# Patient Record
Sex: Male | Born: 1935 | Race: White | Hispanic: No | Marital: Married | Smoking: Former smoker
Health system: Southern US, Community
[De-identification: ages and names within clinical notes are randomized; demographics above are authoritative.]

## PROBLEM LIST (undated history)

## (undated) DIAGNOSIS — E119 Type 2 diabetes mellitus without complications: Secondary | ICD-10-CM

## (undated) DIAGNOSIS — I739 Peripheral vascular disease, unspecified: Secondary | ICD-10-CM

## (undated) DIAGNOSIS — I10 Essential (primary) hypertension: Secondary | ICD-10-CM

## (undated) DIAGNOSIS — F0390 Unspecified dementia without behavioral disturbance: Secondary | ICD-10-CM

## (undated) DIAGNOSIS — E785 Hyperlipidemia, unspecified: Secondary | ICD-10-CM

## (undated) DIAGNOSIS — G459 Transient cerebral ischemic attack, unspecified: Secondary | ICD-10-CM

## (undated) DIAGNOSIS — N400 Enlarged prostate without lower urinary tract symptoms: Secondary | ICD-10-CM

---

## 2006-10-06 ENCOUNTER — Ambulatory Visit: Payer: Self-pay | Admitting: Unknown Physician Specialty

## 2007-03-09 ENCOUNTER — Ambulatory Visit: Payer: Self-pay

## 2007-04-04 ENCOUNTER — Ambulatory Visit: Payer: Self-pay | Admitting: Rheumatology

## 2007-06-06 ENCOUNTER — Emergency Department: Payer: Self-pay | Admitting: Emergency Medicine

## 2007-06-06 ENCOUNTER — Other Ambulatory Visit: Payer: Self-pay

## 2008-06-16 ENCOUNTER — Inpatient Hospital Stay: Payer: Self-pay | Admitting: Internal Medicine

## 2014-01-28 ENCOUNTER — Ambulatory Visit: Payer: Self-pay | Admitting: Physician Assistant

## 2014-04-16 ENCOUNTER — Emergency Department
Admit: 2014-04-16 | Disposition: A | Discharge status: Home / Self Care | Payer: Self-pay | Admitting: Emergency Medicine

## 2014-07-21 ENCOUNTER — Other Ambulatory Visit: Payer: Self-pay

## 2014-07-21 ENCOUNTER — Emergency Department: Payer: Commercial Managed Care - HMO

## 2014-07-21 ENCOUNTER — Emergency Department
Admission: EM | Admit: 2014-07-21 | Discharge: 2014-07-22 | Disposition: A | Discharge status: Home / Self Care | Payer: Commercial Managed Care - HMO | Attending: Emergency Medicine | Admitting: Emergency Medicine

## 2014-07-21 ENCOUNTER — Encounter: Payer: Self-pay | Admitting: Emergency Medicine

## 2014-07-21 DIAGNOSIS — M542 Cervicalgia: Secondary | ICD-10-CM

## 2014-07-21 DIAGNOSIS — S199XXA Unspecified injury of neck, initial encounter: Secondary | ICD-10-CM | POA: Insufficient documentation

## 2014-07-21 DIAGNOSIS — W01198A Fall on same level from slipping, tripping and stumbling with subsequent striking against other object, initial encounter: Secondary | ICD-10-CM | POA: Insufficient documentation

## 2014-07-21 DIAGNOSIS — S3992XA Unspecified injury of lower back, initial encounter: Secondary | ICD-10-CM | POA: Diagnosis present

## 2014-07-21 DIAGNOSIS — Z87891 Personal history of nicotine dependence: Secondary | ICD-10-CM | POA: Diagnosis not present

## 2014-07-21 DIAGNOSIS — Y9289 Other specified places as the place of occurrence of the external cause: Secondary | ICD-10-CM | POA: Insufficient documentation

## 2014-07-21 DIAGNOSIS — Z880 Allergy status to penicillin: Secondary | ICD-10-CM | POA: Diagnosis not present

## 2014-07-21 DIAGNOSIS — Y9389 Activity, other specified: Secondary | ICD-10-CM | POA: Diagnosis not present

## 2014-07-21 DIAGNOSIS — S8991XA Unspecified injury of right lower leg, initial encounter: Secondary | ICD-10-CM | POA: Diagnosis not present

## 2014-07-21 DIAGNOSIS — W19XXXA Unspecified fall, initial encounter: Secondary | ICD-10-CM

## 2014-07-21 DIAGNOSIS — M79604 Pain in right leg: Secondary | ICD-10-CM

## 2014-07-21 DIAGNOSIS — Y998 Other external cause status: Secondary | ICD-10-CM | POA: Insufficient documentation

## 2014-07-21 DIAGNOSIS — E119 Type 2 diabetes mellitus without complications: Secondary | ICD-10-CM | POA: Diagnosis not present

## 2014-07-21 HISTORY — DX: Type 2 diabetes mellitus without complications: E11.9

## 2014-07-21 HISTORY — DX: Transient cerebral ischemic attack, unspecified: G45.9

## 2014-07-21 LAB — BASIC METABOLIC PANEL WITH GFR
Anion gap: 10 (ref 5–15)
BUN: 19 mg/dL (ref 6–20)
CO2: 26 mmol/L (ref 22–32)
Calcium: 9.8 mg/dL (ref 8.9–10.3)
Chloride: 98 mmol/L — ABNORMAL LOW (ref 101–111)
Creatinine, Ser: 1.19 mg/dL (ref 0.61–1.24)
GFR calc Af Amer: >60 mL/min (ref >60)
GFR calc non Af Amer: 53 mL/min — ABNORMAL LOW (ref >60)
Glucose, Bld: 194 mg/dL — ABNORMAL HIGH (ref 65–99)
Potassium: 4.2 mmol/L (ref 3.5–5.1)
Sodium: 134 mmol/L — ABNORMAL LOW (ref 135–145)

## 2014-07-21 LAB — CBC
HCT: 40.6 % (ref 40.0–52.0)
Hemoglobin: 13 g/dL (ref 13.0–18.0)
MCH: 26 pg (ref 26.0–34.0)
MCHC: 32 g/dL (ref 32.0–36.0)
MCV: 81.2 fL (ref 80.0–100.0)
Platelets: 174 10*3/uL (ref 150–440)
RBC: 5 MIL/uL (ref 4.40–5.90)
RDW: 14.1 % (ref 11.5–14.5)
WBC: 8.1 10*3/uL (ref 3.8–10.6)

## 2014-07-21 LAB — GLUCOSE, CAPILLARY: Glucose-Capillary: 189 mg/dL — ABNORMAL HIGH (ref 65–99)

## 2014-07-21 NOTE — ED Notes (Signed)
MD at bedside. 

## 2014-07-21 NOTE — ED Notes (Signed)
Patient transported to X-ray 

## 2014-07-21 NOTE — ED Notes (Signed)
Patient brought in by ems from home. Patient states that he got a little "skakey" and fell. Patient states that he did hit the back of his head but denies LOC. Patient with complaint of right upper leg pain.

## 2014-07-22 LAB — URINALYSIS COMPLETE WITH MICROSCOPIC (ARMC ONLY)
Bacteria, UA: NONE SEEN
Bilirubin Urine: NEGATIVE
Glucose, UA: 50 mg/dL — AB
HGB URINE DIPSTICK: NEGATIVE
Ketones, ur: NEGATIVE mg/dL
LEUKOCYTES UA: NEGATIVE
Nitrite: NEGATIVE
PH: 7 (ref 5.0–8.0)
Protein, ur: NEGATIVE mg/dL
RBC / HPF: NONE SEEN RBC/hpf (ref 0–5)
SPECIFIC GRAVITY, URINE: 1.008 (ref 1.005–1.030)

## 2014-07-22 LAB — TROPONIN I: Troponin I: <0.03 ng/mL (ref <0.031)

## 2014-07-22 MED ORDER — OXYCODONE-ACETAMINOPHEN 5-325 MG PO TABS
1.0000 | ORAL_TABLET | Freq: Once | ORAL | Status: AC
  Administered 2014-07-22: 1 via ORAL

## 2014-07-22 MED ORDER — OXYCODONE-ACETAMINOPHEN 5-325 MG PO TABS
ORAL_TABLET | ORAL | Status: AC
  Administered 2014-07-22: 1 via ORAL
  Filled 2014-07-22: qty 1

## 2014-07-22 NOTE — Discharge Instructions (Signed)
Musculoskeletal Pain Musculoskeletal pain is muscle and boney aches and pains. These pains can occur in any part of the body. Your caregiver may treat you without knowing the cause of the pain. They may treat you if blood or urine tests, X-rays, and other tests were normal.  CAUSES There is often not a definite cause or reason for these pains. These pains may be caused by a type of germ (virus). The discomfort may also come from overuse. Overuse includes working out too hard when your body is not fit. Boney aches also come from weather changes. Bone is sensitive to atmospheric pressure changes. HOME CARE INSTRUCTIONS   Ask when your test results will be ready. Make sure you get your test results.  Only take over-the-counter or prescription medicines for pain, discomfort, or fever as directed by your caregiver. If you were given medications for your condition, do not drive, operate machinery or power tools, or sign legal documents for 24 hours. Do not drink alcohol. Do not take sleeping pills or other medications that may interfere with treatment.  Continue all activities unless the activities cause more pain. When the pain lessens, slowly resume normal activities. Gradually increase the intensity and duration of the activities or exercise.  During periods of severe pain, bed rest may be helpful. Lay or sit in any position that is comfortable.  Putting ice on the injured area.  Put ice in a bag.  Place a towel between your skin and the bag.  Leave the ice on for 15 to 20 minutes, 3 to 4 times a day.  Follow up with your caregiver for continued problems and no reason can be found for the pain. If the pain becomes worse or does not go away, it may be necessary to repeat tests or do additional testing. Your caregiver may need to look further for a possible cause. SEEK IMMEDIATE MEDICAL CARE IF:  You have pain that is getting worse and is not relieved by medications.  You develop chest pain  that is associated with shortness or breath, sweating, feeling sick to your stomach (nauseous), or throw up (vomit).  Your pain becomes localized to the abdomen.  You develop any new symptoms that seem different or that concern you. MAKE SURE YOU:   Understand these instructions.  Will watch your condition.  Will get help right away if you are not doing well or get worse. Document Released: 12/28/2004 Document Revised: 03/22/2011 Document Reviewed: 09/01/2012 [facility] Patient Information 2015 [place], [place]. This information is not intended to replace advice given to you by your health care provider. Make sure you discuss any questions you have with your health care provider.  Cervical Sprain A cervical sprain is an injury in the neck in which the strong, fibrous tissues (ligaments) that connect your neck bones stretch or tear. Cervical sprains can range from mild to severe. Severe cervical sprains can cause the neck vertebrae to be unstable. This can lead to damage of the spinal cord and can result in serious nervous system problems. The amount of time it takes for a cervical sprain to get better depends on the cause and extent of the injury. Most cervical sprains heal in 1 to 3 weeks. CAUSES  Severe cervical sprains may be caused by:   Contact sport injuries (such as from football, rugby, wrestling, hockey, auto racing, gymnastics, diving, martial arts, or boxing).   Motor vehicle collisions.   Whiplash injuries. This is an injury from a sudden forward and backward whipping  movement of the head and neck.  Falls.  Mild cervical sprains may be caused by:   Being in an awkward position, such as while cradling a telephone between your ear and shoulder.   Sitting in a chair that does not offer proper support.   Working at a poorly Marketing executive station.   Looking up or down for long periods of time.  SYMPTOMS   Pain, soreness, stiffness, or a burning sensation in the  front, back, or sides of the neck. This discomfort may develop immediately after the injury or slowly, 24 hours or more after the injury.   Pain or tenderness directly in the middle of the back of the neck.   Shoulder or upper back pain.   Limited ability to move the neck.   Headache.   Dizziness.   Weakness, numbness, or tingling in the hands or arms.   Muscle spasms.   Difficulty swallowing or chewing.   Tenderness and swelling of the neck.  DIAGNOSIS  Most of the time your health care provider can diagnose a cervical sprain by taking your history and doing a physical exam. Your health care provider will ask about previous neck injuries and any known neck problems, such as arthritis in the neck. X-rays may be taken to find out if there are any other problems, such as with the bones of the neck. Other tests, such as a CT scan or MRI, may also be needed.  TREATMENT  Treatment depends on the severity of the cervical sprain. Mild sprains can be treated with rest, keeping the neck in place (immobilization), and pain medicines. Severe cervical sprains are immediately immobilized. Further treatment is done to help with pain, muscle spasms, and other symptoms and may include:  Medicines, such as pain relievers, numbing medicines, or muscle relaxants.   Physical therapy. This may involve stretching exercises, strengthening exercises, and posture training. Exercises and improved posture can help stabilize the neck, strengthen muscles, and help stop symptoms from returning.  HOME CARE INSTRUCTIONS   Put ice on the injured area.   Put ice in a plastic bag.   Place a towel between your skin and the bag.   Leave the ice on for 15-20 minutes, 3-4 times a day.   If your injury was severe, you may have been given a cervical collar to wear. A cervical collar is a two-piece collar designed to keep your neck from moving while it heals.  Do not remove the collar unless instructed  by your health care provider.  If you have long hair, keep it outside of the collar.  Ask your health care provider before making any adjustments to your collar. Minor adjustments may be required over time to improve comfort and reduce pressure on your chin or on the back of your head.  Ifyou are allowed to remove the collar for cleaning or bathing, follow your health care provider's instructions on how to do so safely.  Keep your collar clean by wiping it with mild soap and water and drying it completely. If the collar you have been given includes removable pads, remove them every 1-2 days and hand wash them with soap and water. Allow them to air dry. They should be completely dry before you wear them in the collar.  If you are allowed to remove the collar for cleaning and bathing, wash and dry the skin of your neck. Check your skin for irritation or sores. If you see any, tell your health care provider.  Do not drive while wearing the collar.   Only take over-the-counter or prescription medicines for pain, discomfort, or fever as directed by your health care provider.   Keep all follow-up appointments as directed by your health care provider.   Keep all physical therapy appointments as directed by your health care provider.   Make any needed adjustments to your workstation to promote good posture.   Avoid positions and activities that make your symptoms worse.   Warm up and stretch before being active to help prevent problems.  SEEK MEDICAL CARE IF:   Your pain is not controlled with medicine.   You are unable to decrease your pain medicine over time as planned.   Your activity level is not improving as expected.  SEEK IMMEDIATE MEDICAL CARE IF:   You develop any bleeding.  You develop stomach upset.  You have signs of an allergic reaction to your medicine.   Your symptoms get worse.   You develop new, unexplained symptoms.   You have numbness, tingling,  weakness, or paralysis in any part of your body.  MAKE SURE YOU:   Understand these instructions.  Will watch your condition.  Will get help right away if you are not doing well or get worse. Document Released: 10/25/2006 Document Revised: 01/02/2013 Document Reviewed: 07/05/2012 [facility] Patient Information 2015 [place], [place]. This information is not intended to replace advice given to you by your health care provider. Make sure you discuss any questions you have with your health care provider.  Fall Prevention and Home Safety Falls cause injuries and can affect all age groups. It is possible to use preventive measures to significantly decrease the likelihood of falls. There are many simple measures which can make your home safer and prevent falls. OUTDOORS  Repair cracks and edges of walkways and driveways.  Remove high doorway thresholds.  Trim shrubbery on the main path into your home.  Have good outside lighting.  Clear walkways of tools, rocks, debris, and clutter.  Check that handrails are not broken and are securely fastened. Both sides of steps should have handrails.  Have leaves, snow, and ice cleared regularly.  Use sand or salt on walkways during winter months.  In the garage, clean up grease or oil spills. BATHROOM  Install night lights.  Install grab bars by the toilet and in the tub and shower.  Use non-skid mats or decals in the tub or shower.  Place a plastic non-slip stool in the shower to sit on, if needed.  Keep floors dry and clean up all water on the floor immediately.  Remove soap buildup in the tub or shower on a regular basis.  Secure bath mats with non-slip, double-sided rug tape.  Remove throw rugs and tripping hazards from the floors. BEDROOMS  Install night lights.  Make sure a bedside light is easy to reach.  Do not use oversized bedding.  Keep a telephone by your bedside.  Have a firm chair with side arms to use for getting  dressed.  Remove throw rugs and tripping hazards from the floor. KITCHEN  Keep handles on pots and pans turned toward the center of the stove. Use back burners when possible.  Clean up spills quickly and allow time for drying.  Avoid walking on wet floors.  Avoid hot utensils and knives.  Position shelves so they are not too high or low.  Place commonly used objects within easy reach.  If necessary, use a sturdy step stool with a grab bar  when reaching.  Keep electrical cables out of the way.  Do not use floor polish or wax that makes floors slippery. If you must use wax, use non-skid floor wax.  Remove throw rugs and tripping hazards from the floor. STAIRWAYS  Never leave objects on stairs.  Place handrails on both sides of stairways and use them. Fix any loose handrails. Make sure handrails on both sides of the stairways are as long as the stairs.  Check carpeting to make sure it is firmly attached along stairs. Make repairs to worn or loose carpet promptly.  Avoid placing throw rugs at the top or bottom of stairways, or properly secure the rug with carpet tape to prevent slippage. Get rid of throw rugs, if possible.  Have an electrician put in a light switch at the top and bottom of the stairs. OTHER FALL PREVENTION TIPS  Wear low-heel or rubber-soled shoes that are supportive and fit well. Wear closed toe shoes.  When using a stepladder, make sure it is fully opened and both spreaders are firmly locked. Do not climb a closed stepladder.  Add color or contrast paint or tape to grab bars and handrails in your home. Place contrasting color strips on first and last steps.  Learn and use mobility aids as needed. Install an electrical emergency response system.  Turn on lights to avoid dark areas. Replace light bulbs that burn out immediately. Get light switches that glow.  Arrange furniture to create clear pathways. Keep furniture in the same place.  Firmly attach  carpet with non-skid or double-sided tape.  Eliminate uneven floor surfaces.  Select a carpet pattern that does not visually hide the edge of steps.  Be aware of all pets. OTHER HOME SAFETY TIPS  Set the water temperature for 120 F (48.8 C).  Keep emergency numbers on or near the telephone.  Keep smoke detectors on every level of the home and near sleeping areas. Document Released: 12/18/2001 Document Revised: 06/29/2011 Document Reviewed: 03/19/2011 [facility] Patient Information 2015 [place], [place]. This information is not intended to replace advice given to you by your health care provider. Make sure you discuss any questions you have with your health care provider.

## 2014-07-22 NOTE — ED Provider Notes (Signed)
[facility] Emergency Department Provider Note  ____________________________________________  Time seen: Approximately 1118 PM  I have reviewed the triage vital signs and the nursing notes.   HISTORY  Chief Complaint Fall and Leg Pain    HPI Frank Robertson is a 81 y.o. male who comes in tonight with a fall. According to the patient's family he went to the bathroom this evening. The patient is typically unsteady still uses a walker to get around the house and a cane when in the bathroom. The patient's daughter reports that he could not get his walker into the bathroom but did not use his cane. He went to sit on the toilet and the patient reports that he had a dizzy spell. He reports it is not uncommon for him to have these spells and he then lost his balance and fell backwards into the bathtub. The patient hit the back of his head when he fell. After the fall the patient developed some pain in his right leg and tell bone. The patient did not have any loss of consciousness as he did cry out and his wife and daughter ran to check on him. The patient's daughter reports that he does some exercises to strengthen his legs but whenever he does not do those exercises he has more frequent falls and instability. They brought him in for evaluation.The patient reports that his pain is a 3 out of 10 in intensity   Past Medical History  Diagnosis Date  . TIA (transient ischemic attack)   . Diabetes mellitus without complication     There are no active problems to display for this patient.   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  Allergies Penicillins  History reviewed. No pertinent family history.  Social History History  Substance Use Topics  . Smoking status: Former Games developer  . Smokeless tobacco: Not on file  . Alcohol Use: No    Review of  Systems Constitutional: No fever/chills Eyes: No visual changes. ENT: No sore throat. Cardiovascular: Denies chest pain. Respiratory: Denies shortness of breath. Gastrointestinal: No abdominal pain.  No nausea, no vomiting.  No diarrhea.  No constipation. Genitourinary: Negative for dysuria. Musculoskeletal: Pain in low back, pain in right leg Skin: Negative for rash. Neurological: Negative for headaches, focal weakness or numbness.  10-point ROS otherwise negative.  ____________________________________________   PHYSICAL EXAM:  VITAL SIGNS: ED Triage Vitals  Enc Vitals Group     BP 07/21/14 2217 189/84 mmHg     Pulse Rate 07/21/14 2217 94     Resp 07/21/14 2217 18     Temp 07/21/14 2217 97.4 F (36.3 C)     Temp Source 07/21/14 2217 Oral     SpO2 07/21/14 2217 98 %     Weight 07/21/14 2208 170 lb (77.111 kg)     Height 07/21/14 2208  (1.702 m)     Head Cir --      Peak Flow --      Pain Score 07/21/14 2208 3     Pain Loc --      Pain Edu? --      Excl. in GC? --     Constitutional: Alert and oriented. Well appearing and in no acute distress. Eyes: Conjunctivae are normal. PERRL. EOMI. Head: Atraumatic. Nose: No congestion/rhinnorhea. Mouth/Throat: Mucous membranes are moist.  Oropharynx non-erythematous. Neck: No cervical spine tenderness to palpation Cardiovascular: Normal rate, regular rhythm.  Grossly normal heart sounds.  Good peripheral circulation. Respiratory: Normal respiratory effort.  No retractions. Lungs CTAB. Gastrointestinal: Soft and nontender. No distention. No abdominal bruits. No CVA tenderness. Genitourinary: Deferred Musculoskeletal: Pain with movement of right lower extremity Neurologic:  Normal speech and language. No gross focal neurologic deficits are appreciated.  Skin:  Skin is warm, dry and intact. No rash noted. Psychiatric: Mood and affect are normal.   ____________________________________________   LABS (all labs ordered are  listed, but only abnormal results are displayed)  Labs Reviewed  GLUCOSE, CAPILLARY - Abnormal; Notable for the following:    Glucose-Capillary 189 (*)    All other components within normal limits  BASIC METABOLIC PANEL - Abnormal; Notable for the following:    Sodium 134 (*)    Chloride 98 (*)    Glucose, Bld 194 (*)    GFR calc non Af Amer 53 (*)    All other components within normal limits  CBC  TROPONIN I  URINALYSIS COMPLETEWITH MICROSCOPIC ([facility] ONLY)   ____________________________________________  EKG  ED ECG REPORT I, Rebecka ApleyWebster,  Deunte Bledsoe P, the attending physician, personally viewed and interpreted this ECG.   Date: 07/22/2014  EKG Time: 2208  Rate: 96  Rhythm: normal sinus rhythm  Axis: Normal  Intervals:none  ST&T Change: None  ____________________________________________  RADIOLOGY  CT head and cervical spine: No acute intracranial abnormalities, diffuse chronic atrophy and small vessel ischemic changes, normal alignment of the cervical spine, diffuse generative change, no acute displaced fractures.  Right hip with pelvis: Degenerative changes in the right hip, no acute fracture or dislocation  Right femur: No acute bony abnormalities ____________________________________________   PROCEDURES  Procedure(s) performed: None  Critical Care performed: No  ____________________________________________   INITIAL IMPRESSION / ASSESSMENT AND PLAN / ED COURSE  Pertinent labs & imaging results that were available during my care of the patient were reviewed by me and considered in my medical decision making (see chart for details).  This is an 81 year old male who came in today after a fall when going to the bathroom. The patient did have some pain in his right hip as well as in his low back. The x-rays currently are unremarkable. We will do some blood work to determine if there is another cause of the patient's dizziness and then reassess the patient to  determine if he is able to ambulate without difficulty.  ----------------------------------------- 3:58 AM on 07/22/2014 -----------------------------------------  The patient's blood work is unremarkable. The patient did receive a dose of Percocet as he did have some right hip and neck pain. After the medication the patient was able to ambulate with his walker without any significant difficulty. The patient be discharged home to follow-up with his primary care physician. I discussed this with the patient and his daughter. They have no further complaints or concerns at this time. ____________________________________________   FINAL CLINICAL IMPRESSION(S) / ED DIAGNOSES  Final diagnoses:  Fall, initial encounter  Pain of right lower extremity  Neck pain      Rebecka ApleyAllison P Castor Gittleman, MD 07/22/14 [phone]

## 2014-07-22 NOTE — ED Notes (Signed)
MD at bedside. 

## 2014-09-11 ENCOUNTER — Emergency Department: Payer: Commercial Managed Care - HMO

## 2014-09-11 ENCOUNTER — Other Ambulatory Visit: Payer: Self-pay

## 2014-09-11 ENCOUNTER — Emergency Department
Admission: EM | Admit: 2014-09-11 | Discharge: 2014-09-11 | Disposition: A | Discharge status: Home / Self Care | Payer: Commercial Managed Care - HMO | Attending: Emergency Medicine | Admitting: Emergency Medicine

## 2014-09-11 ENCOUNTER — Encounter: Payer: Self-pay | Admitting: Emergency Medicine

## 2014-09-11 DIAGNOSIS — R531 Weakness: Secondary | ICD-10-CM

## 2014-09-11 DIAGNOSIS — Z7982 Long term (current) use of aspirin: Secondary | ICD-10-CM | POA: Diagnosis not present

## 2014-09-11 DIAGNOSIS — R2689 Other abnormalities of gait and mobility: Secondary | ICD-10-CM | POA: Insufficient documentation

## 2014-09-11 DIAGNOSIS — Z87891 Personal history of nicotine dependence: Secondary | ICD-10-CM | POA: Insufficient documentation

## 2014-09-11 DIAGNOSIS — Z791 Long term (current) use of non-steroidal anti-inflammatories (NSAID): Secondary | ICD-10-CM | POA: Diagnosis not present

## 2014-09-11 DIAGNOSIS — Z79899 Other long term (current) drug therapy: Secondary | ICD-10-CM | POA: Diagnosis not present

## 2014-09-11 DIAGNOSIS — Z880 Allergy status to penicillin: Secondary | ICD-10-CM | POA: Insufficient documentation

## 2014-09-11 DIAGNOSIS — E119 Type 2 diabetes mellitus without complications: Secondary | ICD-10-CM | POA: Diagnosis not present

## 2014-09-11 DIAGNOSIS — R630 Anorexia: Secondary | ICD-10-CM | POA: Insufficient documentation

## 2014-09-11 DIAGNOSIS — F0390 Unspecified dementia without behavioral disturbance: Secondary | ICD-10-CM | POA: Diagnosis not present

## 2014-09-11 DIAGNOSIS — R262 Difficulty in walking, not elsewhere classified: Secondary | ICD-10-CM

## 2014-09-11 LAB — BASIC METABOLIC PANEL
ANION GAP: 9 (ref 5–15)
BUN: 27 mg/dL — ABNORMAL HIGH (ref 6–20)
CALCIUM: 10.3 mg/dL (ref 8.9–10.3)
CO2: 29 mmol/L (ref 22–32)
Chloride: 96 mmol/L — ABNORMAL LOW (ref 101–111)
Creatinine, Ser: 1.39 mg/dL — ABNORMAL HIGH (ref 0.61–1.24)
GFR, EST AFRICAN AMERICAN: 51 mL/min — AB (ref >60)
GFR, EST NON AFRICAN AMERICAN: 44 mL/min — AB (ref >60)
Glucose, Bld: 159 mg/dL — ABNORMAL HIGH (ref 65–99)
Potassium: 4.7 mmol/L (ref 3.5–5.1)
Sodium: 134 mmol/L — ABNORMAL LOW (ref 135–145)

## 2014-09-11 LAB — URINALYSIS COMPLETE WITH MICROSCOPIC (ARMC ONLY)
BILIRUBIN URINE: NEGATIVE
Bacteria, UA: NONE SEEN
Glucose, UA: NEGATIVE mg/dL
HGB URINE DIPSTICK: NEGATIVE
Ketones, ur: NEGATIVE mg/dL
NITRITE: NEGATIVE
PROTEIN: NEGATIVE mg/dL
SPECIFIC GRAVITY, URINE: 1.008 (ref 1.005–1.030)
pH: 7 (ref 5.0–8.0)

## 2014-09-11 LAB — CBC
HEMATOCRIT: 41 % (ref 40.0–52.0)
HEMOGLOBIN: 13.2 g/dL (ref 13.0–18.0)
MCH: 25.8 pg — ABNORMAL LOW (ref 26.0–34.0)
MCHC: 32.2 g/dL (ref 32.0–36.0)
MCV: 80.1 fL (ref 80.0–100.0)
Platelets: 188 10*3/uL (ref 150–440)
RBC: 5.11 MIL/uL (ref 4.40–5.90)
RDW: 14.4 % (ref 11.5–14.5)
WBC: 9.4 10*3/uL (ref 3.8–10.6)

## 2014-09-11 MED ORDER — SODIUM CHLORIDE 0.9 % IV BOLUS (SEPSIS)
500.0000 mL | Freq: Once | INTRAVENOUS | Status: AC
Start: 2014-09-11 — End: 2014-09-11
  Administered 2014-09-11: 500 mL via INTRAVENOUS

## 2014-09-11 MED ORDER — SODIUM CHLORIDE 0.9 % IV BOLUS (SEPSIS)
500.0000 mL | Freq: Once | INTRAVENOUS | Status: AC
  Administered 2014-09-11: 500 mL via INTRAVENOUS

## 2014-09-11 NOTE — ED Provider Notes (Signed)
[facility] Emergency Department Provider Note  ____________________________________________  Time seen: 1220 pm  I have reviewed the triage vital signs and the nursing notes.  Patient is alert and communicative but a little bit confused. History is from him and his daughter.  HISTORY  Chief Complaint Weakness and Anorexia    HPI Frank Robertson is a 81 y.o. male who lives at home but has been having increasing weakness over the past 2 months or more.  The family spoke with his primary care doctor's office. They're advised to go to the emergency department due to the slow progression of increased weakness. The report he needs additional assistance. He has been falling more. The last fall was on Sunday. There is no complaint of pain or dysfunction following the fall. They're concerned because he is not eating very well. He has lost a fair amount of weight. They are trying to get him to drink Ensure's to help increase his caloric intake. They feel he may need additional care and what they can provide at home.  They deny any chest pain, shortness of breath, or vomiting. The patient has not had any diarrhea. There is been no fever.    Past Medical History  Diagnosis Date  . TIA (transient ischemic attack)   . Diabetes mellitus without complication     There are no active problems to display for this patient.   No past surgical history on file.  Current Outpatient Rx  Name  Route  Sig  Dispense  Refill  . acetaminophen (TYLENOL) 500 MG tablet   Oral   Take 500 mg by mouth every 6 (six) hours as needed.         Marland Kitchen aspirin EC 81 MG tablet   Oral   Take 81 mg by mouth daily.         . calcium-vitamin D (OSCAL WITH D) 500-200 MG-UNIT per tablet   Oral   Take by mouth.         . cholecalciferol (VITAMIN D) 1000 UNITS tablet   Oral   Take 1,000 Units by mouth daily.         Marland Kitchen glipiZIDE (GLUCOTROL) 10 MG tablet   Oral   Take 0.5 tablets  by mouth 2 (two) times daily.          Marland Kitchen ibuprofen (ADVIL,MOTRIN) 200 MG tablet   Oral   Take 400 mg by mouth 2 (two) times daily.         . Magnesium 250 MG TABS   Oral   Take 250 mg by mouth daily.         . metFORMIN (GLUCOPHAGE) 1000 MG tablet   Oral   Take 1 tablet by mouth 2 (two) times daily.         . tamsulosin (FLOMAX) 0.4 MG CAPS capsule   Oral   Take 1 capsule by mouth daily.         . vitamin C (ASCORBIC ACID) 500 MG tablet   Oral   Take 1,000 mg by mouth daily.           Allergies Ace inhibitors; Fluvastatin sodium; Glimepiride; Penicillins; Saxagliptin; Statins; and Tramadol  History reviewed. No pertinent family history.  Social History Social History  Substance Use Topics  . Smoking status: Former Games developer  . Smokeless tobacco: None  . Alcohol Use: No    Review of Systems  Constitutional: Negative for fever. Positive for weakness, loss of appetite, weight loss. ENT: Negative  for sore throat. Cardiovascular: Negative for chest pain. Respiratory: Negative for shortness of breath. Gastrointestinal: Negative for abdominal pain, vomiting and diarrhea. Genitourinary: Negative for dysuria. Musculoskeletal: No myalgias or injuries. Skin: Negative for rash. Neurological: Negative for headaches   10-point ROS otherwise negative.  ____________________________________________   PHYSICAL EXAM:  VITAL SIGNS: ED Triage Vitals  Enc Vitals Group     BP 09/11/14 1109 146/76 mmHg     Pulse Rate 09/11/14 1109 89     Resp 09/11/14 1109 18     Temp 09/11/14 1109 97.6 F (36.4 C)     Temp Source 09/11/14 1109 Oral     SpO2 09/11/14 1109 100 %     Weight --      Height --      Head Cir --      Peak Flow --      Pain Score --      Pain Loc --      Pain Edu? --      Excl. in GC? --     Constitutional:  Alert, communicative, but with some limitation. His daughter provides more detailed answers.. no distress. ENT   Head: Normocephalic  and atraumatic.   Nose: No congestion/rhinnorhea.   Mouth/Throat: Mucous membranes are moist. Cardiovascular: Normal rate, regular rhythm, no murmur noted Respiratory:  Normal respiratory effort, no tachypnea.    Breath sounds are clear and equal bilaterally.  Gastrointestinal: Soft and nontender. No distention.  Back: No muscle spasm, no tenderness, no CVA tenderness. Musculoskeletal: No deformity noted. Nontender with normal range of motion in all extremities.  No noted edema. Neurologic: The patient's memory seems to be somewhat limited. He moves all 4 extremities without difficulty with 5 or 5 strength. He has equal grip strength. He has good dorsal and plantar flexion of his feet. His speech is understandable without slurring. Cranial nerves II through XII are intact.. No gross focal neurologic deficits are appreciated.  Skin:  Skin is warm, dry. No rash noted. Psychiatric: Some memory impairment. Patient is pleasant and calm and cooperative. ____________________________________________    LABS (pertinent positives/negatives)  Labs Reviewed  BASIC METABOLIC PANEL - Abnormal; Notable for the following:    Sodium 134 (*)    Chloride 96 (*)    Glucose, Bld 159 (*)    BUN 27 (*)    Creatinine, Ser 1.39 (*)    GFR calc non Af Amer 44 (*)    GFR calc Af Amer 51 (*)    All other components within normal limits  CBC - Abnormal; Notable for the following:    MCH 25.8 (*)    All other components within normal limits  URINALYSIS COMPLETEWITH MICROSCOPIC ([facility] ONLY) - Abnormal; Notable for the following:    Color, Urine STRAW (*)    APPearance CLEAR (*)    Leukocytes, UA 1+ (*)    Squamous Epithelial / LPF 0-5 (*)    All other components within normal limits  CBG MONITORING, ED     ____________________________________________   EKG  ED ECG REPORT I, Isauro Skelley W, the attending physician, personally viewed and interpreted this ECG.   Date: 09/11/2014  EKG Time:  11:26 AM  Rate: 79  Rhythm: Normal sinus rhythm  Axis: Normal  Intervals: Normal  ST&T Change: None noted   ____________________________________________    RADIOLOGY  CT head: IMPRESSION: No acute intracranial abnormality.  Atrophy, chronic microvascular disease.  Atherosclerotic vascular calcifications  Chest x-ray: IMPRESSION: No acute cardiopulmonary abnormality.   ____________________________________________  PROCEDURES    ____________________________________________   INITIAL IMPRESSION / ASSESSMENT AND PLAN / ED COURSE  Pertinent labs & imaging results that were available during my care of the patient were reviewed by me and considered in my medical decision making (see chart for details).  Pleasant 81 year old male in no acute distress. He is alert and communicative, although with some cognitive limitations. His neurologic function appears to be intact other than his mild memory impairment and minimal confusion. His renal function is slightly elevated which could be consistent with mild dehydration. He is receiving IV fluids currently. A urinalysis is pending. I will reassess him after the urinalysis and the infusion of IV fluids. We will attempt to ambulate him. If he is functional on his feet, he should be able to go home. The patient's family is pondering nursing home placement. I have counseled him on this and advised that they speak with her primary physician further and have him and his office facilitate placement if warranted.  ----------------------------------------- 1:47 PM on 09/11/2014 -----------------------------------------  Urinalysis is negative for infection. There are no ketones present. The patient is requesting something to drink, which we will encourage.  ----------------------------------------- 1:56 PM on 09/11/2014 -----------------------------------------  The patient did not do well drinking water. He appeared to gag and aspirate  on the fluid and started coughing. The family reports that this is not uncommon, but he does better with more solid foods. Due to this dysfunction, I am adding on a CT of the head and checking a chest x-ray for possible aspiration.  ----------------------------------------- 3:57 PM on 09/11/2014 -----------------------------------------  CT scan of the head was negative for acute changes. Chest x-ray showed no acute cardiopulmonary abnormality. I have discussed the difficulty with liquids this patient had with his family. The daughter reports that he has had this type of problem for over a year.   Reassessment of this patient finds him in a similar condition asthma initial assessment. He is alert, communicative, in no acute distress. I have ambulated with the patient. He takes very short steps and appears unsteady. I did not feel comfortable with him walking on his own. I do not feel comfortable escorting him further than a few feet away from the bed.  While I find no acute condition that warrants admission to the hospital, and no treatment in the hospital indicated.  Given this poor ambulatory function, I can understand how it would be difficult to care for him at home. Case management is not available currently. I am paging the patient's primary physician, Dr. Dyke Brackett, in order to help coordinate additional care for this patient on the lines of the family's wishes.  ----------------------------------------- 4:50 PM on 09/11/2014 -----------------------------------------  Dr. Dyke Brackett apparently is out of the office today. I've spoken with Ashlyn from his office. She seems surprised to find out that patients can be placed in nursing facilities as an outpatient. She seems frustrated by the fact that we are not finding any indication for admission.  I have spoken with a hospital-based social worker who will come and speak with the family and call Ashlyn as well as in order to facilitate the care of  this patient requires.  ----------------------------------------- 5:14 PM on 09/11/2014 -----------------------------------------  Social work is being very helpful. They spoke with the family. They provided lists resources. They will speak with case management tomorrow to follow-up on the patient.  We will allow the patient to go home. There are no acute changes that we have found. He is in  the same condition that he has months. No treatment in the hospital is currently indicated. ____________________________________________   FINAL CLINICAL IMPRESSION(S) / ED DIAGNOSES  Final diagnoses:  Dementia, without behavioral disturbance  Weakness generalized  Ambulatory dysfunction      Darien Ramus, MD 09/11/14 1715

## 2014-09-11 NOTE — ED Notes (Signed)
Patient given PO fluids and began coughing instantly.  Patient's daughter informed me that he has been doing this much more often recently at home as well.

## 2014-09-11 NOTE — ED Notes (Signed)
Patient transported to X-ray and CT 

## 2014-09-11 NOTE — Discharge Instructions (Signed)
Tests today overall looked okay. The blood tests looked alright except for mild elevation in the kidney function consistent with mild dehydration. The CT scan of the head showed no new changes. Chest x-ray was normal. We call Dr. Aldean Baker office to help facilitate ongoing care and possible placement if needed. We spoke with Hospital social worker who also spoke with you. Return to the emergency department if there are any other urgent concerns.  Fall Prevention and Home Safety Falls cause injuries and can affect all age groups. It is possible to prevent falls.  HOW TO PREVENT FALLS  Wear shoes with rubber soles that do not have an opening for your toes.  Keep the inside and outside of your house well lit.  Use night lights throughout your home.  Remove clutter from floors.  Clean up floor spills.  Remove throw rugs or fasten them to the floor with carpet tape.  Do not place electrical cords across pathways.  Put grab bars by your tub, shower, and toilet. Do not use towel bars as grab bars.  Put handrails on both sides of the stairway. Fix loose handrails.  Do not climb on stools or stepladders, if possible.  Do not wax your floors.  Repair uneven or unsafe sidewalks, walkways, or stairs.  Keep items you use a lot within reach.  Be aware of pets.  Keep emergency numbers next to the telephone.  Put smoke detectors in your home and near bedrooms. Ask your doctor what other things you can do to prevent falls. Document Released: 10/24/2008 Document Revised: 06/29/2011 Document Reviewed: 03/30/2011 [facility] Patient Information 2015 [employer] [place], [place]. This information is not intended to replace advice given to you by your health care provider. Make sure you discuss any questions you have with your health care provider.

## 2014-09-11 NOTE — Progress Notes (Signed)
LCSW received call from Dr Carollee Massed to assist patient with eldercare resources. Patient was interviewed and supported and stated he is feeling better and will be ok at home.  LCSW provided family with several numbers and provided additional care giver support. Family will go back to family care Doctor Dr Madelin Rear and seek addition home care services as required if needed.  LCSW will follow up with [place] and call daughter [name] [phone]

## 2014-09-11 NOTE — ED Notes (Signed)
Pt here for generalized weakness and poor PO intake.  Family also reports that patient has times of confusion.

## 2015-07-17 ENCOUNTER — Inpatient Hospital Stay
Admission: EM | Admit: 2015-07-17 | Discharge: 2015-07-18 | DRG: 871 | Disposition: A | Discharge status: Hospice | Payer: Commercial Managed Care - HMO | Attending: Internal Medicine | Admitting: Internal Medicine

## 2015-07-17 ENCOUNTER — Emergency Department: Payer: Commercial Managed Care - HMO

## 2015-07-17 ENCOUNTER — Encounter: Payer: Self-pay | Admitting: Emergency Medicine

## 2015-07-17 DIAGNOSIS — Z515 Encounter for palliative care: Secondary | ICD-10-CM

## 2015-07-17 DIAGNOSIS — I10 Essential (primary) hypertension: Secondary | ICD-10-CM | POA: Diagnosis present

## 2015-07-17 DIAGNOSIS — Z87891 Personal history of nicotine dependence: Secondary | ICD-10-CM

## 2015-07-17 DIAGNOSIS — N179 Acute kidney failure, unspecified: Secondary | ICD-10-CM | POA: Diagnosis present

## 2015-07-17 DIAGNOSIS — Z880 Allergy status to penicillin: Secondary | ICD-10-CM | POA: Diagnosis not present

## 2015-07-17 DIAGNOSIS — R627 Adult failure to thrive: Secondary | ICD-10-CM

## 2015-07-17 DIAGNOSIS — L89159 Pressure ulcer of sacral region, unspecified stage: Secondary | ICD-10-CM

## 2015-07-17 DIAGNOSIS — Z79899 Other long term (current) drug therapy: Secondary | ICD-10-CM

## 2015-07-17 DIAGNOSIS — Z66 Do not resuscitate: Secondary | ICD-10-CM

## 2015-07-17 DIAGNOSIS — R451 Restlessness and agitation: Secondary | ICD-10-CM

## 2015-07-17 DIAGNOSIS — G9341 Metabolic encephalopathy: Secondary | ICD-10-CM | POA: Diagnosis present

## 2015-07-17 DIAGNOSIS — F0390 Unspecified dementia without behavioral disturbance: Secondary | ICD-10-CM | POA: Diagnosis present

## 2015-07-17 DIAGNOSIS — A419 Sepsis, unspecified organism: Principal | ICD-10-CM | POA: Diagnosis present

## 2015-07-17 DIAGNOSIS — N401 Enlarged prostate with lower urinary tract symptoms: Secondary | ICD-10-CM | POA: Diagnosis present

## 2015-07-17 DIAGNOSIS — N390 Urinary tract infection, site not specified: Secondary | ICD-10-CM | POA: Diagnosis present

## 2015-07-17 DIAGNOSIS — Z8673 Personal history of transient ischemic attack (TIA), and cerebral infarction without residual deficits: Secondary | ICD-10-CM

## 2015-07-17 DIAGNOSIS — Z888 Allergy status to other drugs, medicaments and biological substances status: Secondary | ICD-10-CM

## 2015-07-17 HISTORY — DX: Hyperlipidemia, unspecified: E78.5

## 2015-07-17 HISTORY — DX: Unspecified dementia, unspecified severity, without behavioral disturbance, psychotic disturbance, mood disturbance, and anxiety: F03.90

## 2015-07-17 HISTORY — DX: Essential (primary) hypertension: I10

## 2015-07-17 HISTORY — DX: Benign prostatic hyperplasia without lower urinary tract symptoms: N40.0

## 2015-07-17 HISTORY — DX: Peripheral vascular disease, unspecified: I73.9

## 2015-07-17 LAB — URINALYSIS COMPLETE WITH MICROSCOPIC (ARMC ONLY)
BILIRUBIN URINE: NEGATIVE
GLUCOSE, UA: 50 mg/dL — AB
KETONES UR: NEGATIVE mg/dL
NITRITE: NEGATIVE
Protein, ur: 100 mg/dL — AB
SPECIFIC GRAVITY, URINE: 1.019 (ref 1.005–1.030)
pH: 5 (ref 5.0–8.0)

## 2015-07-17 LAB — LACTIC ACID, PLASMA
Lactic Acid, Venous: 4.1 mmol/L (ref 0.5–1.9)
Lactic Acid, Venous: 4.7 mmol/L (ref 0.5–1.9)

## 2015-07-17 LAB — CBC WITH DIFFERENTIAL/PLATELET
Basophils Absolute: 0.3 10*3/uL — ABNORMAL HIGH (ref 0–0.1)
Basophils Relative: 2 %
EOS PCT: 0 %
Eosinophils Absolute: 0 10*3/uL (ref 0–0.7)
HEMATOCRIT: 36.3 % — AB (ref 40.0–52.0)
HEMOGLOBIN: 11.8 g/dL — AB (ref 13.0–18.0)
LYMPHS ABS: 2 10*3/uL (ref 1.0–3.6)
LYMPHS PCT: 10 %
MCH: 26.9 pg (ref 26.0–34.0)
MCHC: 32.6 g/dL (ref 32.0–36.0)
MCV: 82.4 fL (ref 80.0–100.0)
Monocytes Absolute: 1.4 10*3/uL — ABNORMAL HIGH (ref 0.2–1.0)
Monocytes Relative: 7 %
Neutro Abs: 16.3 10*3/uL — ABNORMAL HIGH (ref 1.4–6.5)
Neutrophils Relative %: 81 %
PLATELETS: 304 10*3/uL (ref 150–440)
RBC: 4.41 MIL/uL (ref 4.40–5.90)
RDW: 13.6 % (ref 11.5–14.5)
WBC: 20 10*3/uL — AB (ref 3.8–10.6)

## 2015-07-17 LAB — COMPREHENSIVE METABOLIC PANEL
ALK PHOS: 75 U/L (ref 38–126)
ALT: 15 U/L — AB (ref 17–63)
AST: 36 U/L (ref 15–41)
Albumin: 2.5 g/dL — ABNORMAL LOW (ref 3.5–5.0)
Anion gap: 12 (ref 5–15)
BILIRUBIN TOTAL: 0.5 mg/dL (ref 0.3–1.2)
BUN: 112 mg/dL — AB (ref 6–20)
CHLORIDE: 108 mmol/L (ref 101–111)
CO2: 24 mmol/L (ref 22–32)
Calcium: 9 mg/dL (ref 8.9–10.3)
Creatinine, Ser: 3.17 mg/dL — ABNORMAL HIGH (ref 0.61–1.24)
GFR calc Af Amer: 19 mL/min — ABNORMAL LOW (ref >60)
GFR, EST NON AFRICAN AMERICAN: 16 mL/min — AB (ref >60)
Glucose, Bld: 227 mg/dL — ABNORMAL HIGH (ref 65–99)
POTASSIUM: 5.1 mmol/L (ref 3.5–5.1)
SODIUM: 144 mmol/L (ref 135–145)
Total Protein: 6.7 g/dL (ref 6.5–8.1)

## 2015-07-17 LAB — GLUCOSE, CAPILLARY: GLUCOSE-CAPILLARY: 167 mg/dL — AB (ref 65–99)

## 2015-07-17 LAB — TROPONIN I: Troponin I: 0.07 ng/mL (ref <0.03)

## 2015-07-17 MED ORDER — SODIUM CHLORIDE 0.9 % IV BOLUS (SEPSIS)
1000.0000 mL | Freq: Once | INTRAVENOUS | Status: AC
  Administered 2015-07-17: 1000 mL via INTRAVENOUS

## 2015-07-17 MED ORDER — KETOROLAC TROMETHAMINE 0.5 % OP SOLN
1.0000 [drp] | Freq: Three times a day (TID) | OPHTHALMIC | Status: DC
  Administered 2015-07-17: 1 [drp] via OPHTHALMIC
  Filled 2015-07-17: qty 3

## 2015-07-17 MED ORDER — INSULIN ASPART 100 UNIT/ML ~~LOC~~ SOLN: 0.0000 [IU] | Freq: Every day | SUBCUTANEOUS | Status: DC

## 2015-07-17 MED ORDER — LEVOFLOXACIN IN D5W 750 MG/150ML IV SOLN
750.0000 mg | Freq: Once | INTRAVENOUS | Status: AC
  Administered 2015-07-17: 750 mg via INTRAVENOUS
  Filled 2015-07-17: qty 150

## 2015-07-17 MED ORDER — ASPIRIN EC 81 MG PO TBEC: 81.0000 mg | DELAYED_RELEASE_TABLET | Freq: Every day | ORAL | Status: DC

## 2015-07-17 MED ORDER — SODIUM CHLORIDE 0.9 % IV BOLUS (SEPSIS)
1000.0000 mL | Freq: Once | INTRAVENOUS | Status: AC
Start: 2015-07-17 — End: 2015-07-17
  Administered 2015-07-17: 1000 mL via INTRAVENOUS

## 2015-07-17 MED ORDER — VANCOMYCIN HCL IN DEXTROSE 1-5 GM/200ML-% IV SOLN
1000.0000 mg | Freq: Once | INTRAVENOUS | Status: AC
  Administered 2015-07-17: 1000 mg via INTRAVENOUS
  Filled 2015-07-17: qty 200

## 2015-07-17 MED ORDER — DONEPEZIL HCL 5 MG PO TABS
5.0000 mg | ORAL_TABLET | Freq: Every day | ORAL | Status: DC
  Administered 2015-07-17: 5 mg via ORAL
  Filled 2015-07-17: qty 1

## 2015-07-17 MED ORDER — LEVOFLOXACIN IN D5W 500 MG/100ML IV SOLN: 500.0000 mg | INTRAVENOUS | Status: DC

## 2015-07-17 MED ORDER — MIRTAZAPINE 15 MG PO TABS
15.0000 mg | ORAL_TABLET | Freq: Every day | ORAL | Status: DC
  Administered 2015-07-17: 15 mg via ORAL
  Filled 2015-07-17: qty 1

## 2015-07-17 MED ORDER — FENTANYL CITRATE (PF) 100 MCG/2ML IJ SOLN
25.0000 ug | Freq: Once | INTRAMUSCULAR | Status: AC
Start: 2015-07-17 — End: 2015-07-17
  Administered 2015-07-17: 25 ug via INTRAVENOUS
  Filled 2015-07-17: qty 2

## 2015-07-17 MED ORDER — DEXTROSE 5 % IV SOLN
2.0000 g | Freq: Once | INTRAVENOUS | Status: AC
  Administered 2015-07-17: 2 g via INTRAVENOUS
  Filled 2015-07-17: qty 2

## 2015-07-17 MED ORDER — FINASTERIDE 5 MG PO TABS: 5.0000 mg | ORAL_TABLET | Freq: Every day | ORAL | Status: DC

## 2015-07-17 MED ORDER — ENOXAPARIN SODIUM 40 MG/0.4ML ~~LOC~~ SOLN: 40.0000 mg | SUBCUTANEOUS | Status: DC

## 2015-07-17 MED ORDER — TAMSULOSIN HCL 0.4 MG PO CAPS: 0.4000 mg | ORAL_CAPSULE | Freq: Every day | ORAL | Status: DC

## 2015-07-17 MED ORDER — HEPARIN SODIUM (PORCINE) 5000 UNIT/ML IJ SOLN
5000.0000 [IU] | Freq: Two times a day (BID) | INTRAMUSCULAR | Status: DC
  Administered 2015-07-17: 5000 [IU] via SUBCUTANEOUS
  Filled 2015-07-17: qty 1

## 2015-07-17 MED ORDER — INSULIN ASPART 100 UNIT/ML ~~LOC~~ SOLN: 0.0000 [IU] | Freq: Three times a day (TID) | SUBCUTANEOUS | Status: DC

## 2015-07-17 MED ORDER — GLUCERNA SHAKE PO LIQD: 237.0000 mL | Freq: Two times a day (BID) | ORAL | Status: DC

## 2015-07-17 MED ORDER — SODIUM CHLORIDE 0.9 % IV BOLUS (SEPSIS)
500.0000 mL | Freq: Once | INTRAVENOUS | Status: AC
  Administered 2015-07-17: 500 mL via INTRAVENOUS

## 2015-07-17 MED ORDER — SODIUM CHLORIDE 0.9 % IV SOLN
INTRAVENOUS | Status: DC
  Administered 2015-07-17: 23:00:00 via INTRAVENOUS
  Administered 2015-07-18: 1000 mL via INTRAVENOUS

## 2015-07-17 MED ORDER — ACETAMINOPHEN 650 MG RE SUPP
650.0000 mg | Freq: Four times a day (QID) | RECTAL | Status: DC | PRN
  Administered 2015-07-17: 650 mg via RECTAL
  Filled 2015-07-17: qty 1

## 2015-07-17 MED ORDER — ACETAMINOPHEN 325 MG PO TABS: 650.0000 mg | ORAL_TABLET | Freq: Four times a day (QID) | ORAL | Status: DC | PRN

## 2015-07-17 MED ORDER — ONDANSETRON HCL 4 MG/2ML IJ SOLN: 4.0000 mg | Freq: Four times a day (QID) | INTRAMUSCULAR | Status: DC | PRN

## 2015-07-17 MED ORDER — DEXTROSE 5 % IV SOLN
1.0000 g | Freq: Three times a day (TID) | INTRAVENOUS | Status: DC
  Administered 2015-07-18 (×2): 1 g via INTRAVENOUS
  Filled 2015-07-17 (×4): qty 1

## 2015-07-17 MED ORDER — ONDANSETRON HCL 4 MG PO TABS: 4.0000 mg | ORAL_TABLET | Freq: Four times a day (QID) | ORAL | Status: DC | PRN

## 2015-07-17 NOTE — ED Notes (Signed)
CODE  SEPSIS  CALLED  TO  CARELINK 

## 2015-07-17 NOTE — Progress Notes (Signed)
Pharmacy Antibiotic Note  Frank Robertson is a 82 y.o. male admitted on 07/17/2015 with sepsis.  Pharmacy has been consulted for Vancomycin, levofloxacin, and aztreonam dosing. Pt received levofloxacin 750 mg IV x1 at 1620, aztreonam 2 g IV x1 at 1649, and vancomycin 1g IV x1 at 1719 in the ED.  Plan: Will continue dosing with levofloxacin 500 mg IV q48 h and aztreonam 1 g IV q8h.  Vanc 1 g dose provided ~17 mg/kg dose.  Will dose by levels in this acute kidney injury pt. Last yr SCr of ~1.2-1.4. Vanc random level ordered for 1200 on 7/7.   Will need to continue to follow renal function closely and adjust dosing as needed.      Height: 6' (182.9 cm) Weight: 130 lb 6.4 oz (59.149 kg) IBW/kg (Calculated) : 77.6  Temp (24hrs), Avg:98.7 F (37.1 C), Min:98.1 F (36.7 C), Max:99.7 F (37.6 C)   Recent Labs Lab 07/17/15 1600 07/17/15 1925  WBC 20.0*  --   CREATININE 3.17*  --   LATICACIDVEN 4.1* 4.7*    Estimated Creatinine Clearance: 13.7 mL/min (by C-G formula based on Cr of 3.17).    Allergies  Allergen Reactions  . Ace Inhibitors   . Fluvastatin Sodium Other (See Comments)  . Glimepiride Other (See Comments)  . Penicillins   . Saxagliptin Other (See Comments)  . Statins Other (See Comments)    Other Reaction: elevated LFTs  . Tramadol Other (See Comments)    Antimicrobials this admission: Vanc 7/6 >> levaquin 7/6 >> Aztreonam 7/6 >>  Dose adjustments this admission:   Microbiology results: 7/6 BCx: sent 7/ UCx: sent  7/6 MRSA PCR: sent  Thank you for allowing pharmacy to be a part of this patient's care.  Marty HeckWang, Plez Belton L 07/17/2015 9:20 PM

## 2015-07-17 NOTE — H&P (Signed)
[place] [facility] [place] NAME: Frank Robertson    MR#:  [id number]  DATE OF BIRTH:  1926/04/28  DATE OF ADMISSION:  07/17/2015  PRIMARY CARE PHYSICIAN: BABAOFF, Caryl Bis, MD   REQUESTING/REFERRING PHYSICIAN: Dr. Harvest Dark  CHIEF COMPLAINT:   Chief Complaint  Patient presents with  . Shortness of Breath  Weakness, altered mental status.  HISTORY OF PRESENT ILLNESS:  Frank Robertson  is a 82 y.o. male with a known history of Dementia, diabetes, history of previous TIA, BPH with urinary retention status post chronic indwelling Foley, hyperlipidemia who presents to the hospital from a skilled nursing facility due to altered mental status and weakness. Patient himself has advanced dementia and therefore most of the history obtained from the daughter at bedside. As per the daughter patient with a past few days at the skilled nursing facility has been more lethargic, poor by mouth intake, complaining of some mild shortness of breath and altered. As he was not improving he was sent to the ER for further evaluation. In the emergency room patient was noted to be tachycardic, with an elevated lactate, initially hypotensive and also noted to have a leukocytosis. He was noted to have sepsis secondary to UTI or even a possibly infected decubitus ulcer. Hospitalist services were contacted further treatment and evaluation. Patient presently is just grimacing in pain when moved but denies any other complaints presently.  PAST MEDICAL HISTORY:   Past Medical History  Diagnosis Date  . TIA (transient ischemic attack)   . Diabetes mellitus without complication ([place])   . Dementia   . PVD (peripheral vascular disease) ([place])   . BPH (benign prostatic hyperplasia)   . Hyperlipidemia     PAST SURGICAL HISTORY:  History reviewed. No pertinent past surgical history.  SOCIAL HISTORY:   Social History  Substance Use Topics  . Smoking status: Former Research scientist (life sciences)  . Smokeless  tobacco: Not on file  . Alcohol Use: No    FAMILY HISTORY:   Family History  Problem Relation Age of Onset  . Heart disease Father   . Cirrhosis Father     DRUG ALLERGIES:   Allergies  Allergen Reactions  . Ace Inhibitors   . Fluvastatin Sodium Other (See Comments)  . Glimepiride Other (See Comments)  . Penicillins   . Saxagliptin Other (See Comments)  . Statins Other (See Comments)    Other Reaction: elevated LFTs  . Tramadol Other (See Comments)    REVIEW OF SYSTEMS:   Review of Systems  Unable to perform ROS: mental acuity    MEDICATIONS AT HOME:   Prior to Admission medications   Medication Sig Start Date End Date Taking? Authorizing Provider  acetaminophen (TYLENOL) 500 MG tablet Take 500 mg by mouth every 6 (six) hours as needed.   Yes Historical Provider, MD  aspirin EC 81 MG tablet Take 81 mg by mouth daily.   Yes Historical Provider, MD  bromfenac (XIBROM) 0.09 % ophthalmic solution Place 1 drop into the right eye 3 (three) times daily.   Yes Historical Provider, MD  donepezil (ARICEPT) 5 MG tablet Take 5 mg by mouth at bedtime.   Yes Historical Provider, MD  feeding supplement, GLUCERNA SHAKE, (GLUCERNA SHAKE) LIQD Take 237 mLs by mouth 2 (two) times daily.   Yes Historical Provider, MD  finasteride (PROSCAR) 5 MG tablet Take 5 mg by mouth daily.   Yes Historical Provider, MD  glipiZIDE (GLUCOTROL) 5 MG tablet Take 2.5 mg by mouth  2 (two) times daily.   Yes Historical Provider, MD  metFORMIN (GLUCOPHAGE) 1000 MG tablet Take 1 tablet by mouth 2 (two) times daily. 07/30/14  Yes Historical Provider, MD  mirtazapine (REMERON) 15 MG tablet Take 15 mg by mouth at bedtime.   Yes Historical Provider, MD  tamsulosin (FLOMAX) 0.4 MG CAPS capsule Take 1 capsule by mouth daily. 07/30/14  Yes Historical Provider, MD      VITAL SIGNS:  Blood pressure 121/79, pulse 111, temperature 99.7 F (37.6 C), temperature source Rectal, resp. rate 37, SpO2 94 %.  PHYSICAL  EXAMINATION:  Physical Exam  GENERAL:  82 y.o.-year-old patient lying in the bed grimacing in pain when moved.  EYES: Pupils equal, round, reactive to light and accommodation. No scleral icterus. Extraocular muscles intact.  HEENT: Head atraumatic, normocephalic. Oropharynx and nasopharynx clear. No oropharyngeal erythema, dry oral mucosa  NECK:  Supple, no jugular venous distention. No thyroid enlargement, no tenderness.  LUNGS: Normal breath sounds bilaterally, no wheezing, rales, rhonchi. No use of accessory muscles of respiration.  CARDIOVASCULAR: S1, S2 RRR, tachycardic. No murmurs, rubs, gallops, clicks.  ABDOMEN: Soft, nontender, nondistended. Bowel sounds present. No organomegaly or mass.  EXTREMITIES: No pedal edema, cyanosis, or clubbing. + 2 pedal & radial pulses b/l.   NEUROLOGIC: Cranial nerves II through XII are intact. No focal Motor or sensory deficits appreciated b/l. Globally weak PSYCHIATRIC: The patient is alert and oriented x 1.  SKIN: No obvious rash, lesion, Stage II-III Decubitus ulcer.   LABORATORY PANEL:   CBC  Recent Labs Lab 07/17/15 1600  WBC 20.0*  HGB 11.8*  HCT 36.3*  PLT 304   ------------------------------------------------------------------------------------------------------------------  Chemistries   Recent Labs Lab 07/17/15 1600  NA 144  K 5.1  CL 108  CO2 24  GLUCOSE 227*  BUN 112*  CREATININE 3.17*  CALCIUM 9.0  AST 36  ALT 15*  ALKPHOS 75  BILITOT 0.5   ------------------------------------------------------------------------------------------------------------------  Cardiac Enzymes  Recent Labs Lab 07/17/15 1600  TROPONINI 0.07*   ------------------------------------------------------------------------------------------------------------------  RADIOLOGY:  Dg Chest Port 1 View  07/17/2015  CLINICAL DATA:  Tachypnea, local labored breathing EXAM: PORTABLE CHEST 1 VIEW COMPARISON:  09/11/2014 FINDINGS: There is no  focal parenchymal opacity. There is no pleural effusion or pneumothorax. The heart and mediastinal contours are unremarkable. There is evidence of prior median sternotomy with aortic valve replacement. The osseous structures are unremarkable. IMPRESSION: No active disease. Electronically Signed   By: Kathreen Devoid   On: 07/17/2015 16:20     IMPRESSION AND PLAN:   82 year old male with past medical history of dementia, BPH, hypertension, diabetes, history of previous TIA, peripheral vascular disease who presents to the hospital from a skilled nursing facility due to weakness, altered mental status.  1. Altered mental status-this is likely metabolic encephalopathy secondary to underlying sepsis. -Continue treat underlying sepsis with IV fluids, IV antibiotics and follow mental status.  2. Sepsis-patient met criteria admission given his tachycardia, elevated lactate, leukocytosis and also a abnormal urinalysis. -Unclear of the source of sepsis is UTI versus possibly related to infected decubitus ulcer. -I will treat the patient empirically with broad-spectrum IV antibiotics with vancomycin, Levaquin, history and exam. -Follow blood, urine cultures. I will get a wound team consult to evaluate the decubitus ulcer.  3. Urinary tract infection-suspected cause of patient's sepsis. -Continue broad-spectrum IV antibiotics as mentioned above. Follow urine cultures.  4. Stage 2-3 decubitus ulcer-I will get a wound team consult to assess. Continue empiric antibiotics as mentioned above.  5. Acute renal failure-secondary to sepsis, hypotension. -Continue aggressive IV fluids, follow BUN and creatinine and urine output.  6. Dementia-continue Aricept.  7. BPH status post chronic indwelling Foley-continue finasteride, Flomax.  8. Leukocytosis-secondary to sepsis. Follow white cell count with IV] therapy treatment.  All the records are reviewed and case discussed with ED provider. Management plans  discussed with the patient, family and they are in agreement.  CODE STATUS: DO NOT RESUSCITATE  TOTAL TIME TAKING CARE OF THIS PATIENT: 45 minutes.    Henreitta Leber M.D on 07/17/2015 at 7:09 PM  Between 7am to 6pm - Pager - [phone]  After 6pm go to [email or web address] - password [email or web address] [facility]  Office  [phone]  CC: Primary care physician; BABAOFF, Caryl Bis, MD

## 2015-07-17 NOTE — ED Provider Notes (Signed)
[facility] Emergency Department Provider Note  Time seen: 3:58 PM  I have reviewed the triage vital signs and the nursing notes.   HISTORY  Chief Complaint Shortness of Breath    HPI Frank Robertson is a 82 y.o. male with a past medical history of diabetes, TIA, presents the emergency department from [place] [facility] with complaints of difficulty breathing. According to EMS reported they stated they were called out for labored breathing. Upon arrival I found the patient to be breathing approximately 40 times per minute, and tachycardic about 130 bpm. Patient is able to follow simple commands, cannot contribute to his history given baseline dementia and baseline disorientation. Patient does appear to be able to answer questions in a yes no fashion however it is unclear how accurate his answers may be.Upon my evaluation the patient is alert, opens eyes to voice, will take deep breaths upon command. When asked if he is having any pain in the patient shakes his head no. He does appear to be somewhat short of breath breathing approximately 40 breaths per minute. Patient appears dry with dry mucous membranes, with a heart rate of 130 upon arrival.     Past Medical History  Diagnosis Date  . TIA (transient ischemic attack)   . Diabetes mellitus without complication (HCC)     There are no active problems to display for this patient.   History reviewed. No pertinent past surgical history.  Current Outpatient Rx  Name  Route  Sig  Dispense  Refill  . acetaminophen (TYLENOL) 500 MG tablet   Oral   Take 500 mg by mouth every 6 (six) hours as needed.         Marland Kitchen. aspirin EC 81 MG tablet   Oral   Take 81 mg by mouth daily.         . calcium-vitamin D (OSCAL WITH D) 500-200 MG-UNIT per tablet   Oral   Take by mouth.         . cholecalciferol (VITAMIN D) 1000 UNITS tablet   Oral   Take 1,000 Units by mouth daily.         Marland Kitchen. glipiZIDE  (GLUCOTROL) 10 MG tablet   Oral   Take 0.5 tablets by mouth 2 (two) times daily.          Marland Kitchen. ibuprofen (ADVIL,MOTRIN) 200 MG tablet   Oral   Take 400 mg by mouth 2 (two) times daily.         . Magnesium 250 MG TABS   Oral   Take 250 mg by mouth daily.         . metFORMIN (GLUCOPHAGE) 1000 MG tablet   Oral   Take 1 tablet by mouth 2 (two) times daily.         . tamsulosin (FLOMAX) 0.4 MG CAPS capsule   Oral   Take 1 capsule by mouth daily.         . vitamin C (ASCORBIC ACID) 500 MG tablet   Oral   Take 1,000 mg by mouth daily.           Allergies Ace inhibitors; Fluvastatin sodium; Glimepiride; Penicillins; Saxagliptin; Statins; and Tramadol  No family history on file.  Social History Social History  Substance Use Topics  . Smoking status: Former Games developermoker  . Smokeless tobacco: None  . Alcohol Use: No    Review of Systems Constitutional: Negative for fever.No fever reported. Cardiovascular: Patient denies chest pain. Respiratory: Breathing 40  breaths per minute. Gastrointestinal: Denies vomiting. Denies abdominal pain. Genitourinary: Chronic indwelling Foley catheter. 10-point ROS otherwise negative.  ____________________________________________   PHYSICAL EXAM:  VITAL SIGNS: ED Triage Vitals  Enc Vitals Group     BP 07/17/15 1553 121/76 mmHg     Pulse Rate 07/17/15 1553 135     Resp 07/17/15 1553 40     Temp 07/17/15 1553 98.4 F (36.9 C)     Temp Source 07/17/15 1553 Axillary     SpO2 07/17/15 1553 97 %     Weight --      Height --      Head Cir --      Peak Flow --      Pain Score --      Pain Loc --      Pain Edu? --      Excl. in GC? --     Constitutional: Alert. We'll follow very simple commands, will answer yes no questions.. Eyes: Normal exam ENT   Head: Normocephalic and atraumatic.   Mouth/Throat: Dry mucous membranes. Cardiovascular: Regular rhythm and rate around 130 bpm. Respiratory: Tachypnea around 40 breaths  per minute. Clear lung sounds, no wheeze, rales or rhonchi. Gastrointestinal: Soft and nontender. No distention.  No reaction to abdominal palpation. Musculoskeletal: Nontender with normal range of motion in all extremities. No lower extremity tenderness or edema. Atrophic extremities. Neurologic:  Does not speak, shakes his head yes and no. Unable to adequately perform neurologic exam. Skin:  Skin is warm, dry and intact.  Psychiatric: Mood and affect are normal.   ____________________________________________    EKG  EKG reviewed and interpreted by myself shows sinus tachycardia 129 bpm, narrow QRS, normal axis, normal intervals, nonspecific but no concerning ST changes. No ST elevations.  ____________________________________________    RADIOLOGY  Chest x-ray negative  ____________________________________________   INITIAL IMPRESSION / ASSESSMENT AND PLAN / ED COURSE  Pertinent labs & imaging results that were available during my care of the patient were reviewed by me and considered in my medical decision making (see chart for details).  The patient presents the emergency department with tachypnea and tachycardia. Patient does appear dry with dry mucous membranes. Patient is nonambulatory at baseline. Per EMS report the patient has limited mental capacity at baseline, disoriented at baseline, will occasionally follow commands but not always. Patient is afebrile in the emergency department, he is tachycardic and tachypneic. Patient doesn't chronic indwelling Foley catheter, possible infectious source. Given his borderline blood pressure with tachycardia and tachypnea we will begin sepsis protocols, until we have further differentiated the patient.  Chest x-ray negative. Patient's labs show an elevated lactic acid of 4.1, leukocytosis of 20,000. Patient's has a chronic indwelling Foley catheter but a urinalysis consistent with urinary tract infection. Patient also has an approximate 6  cm sacral ulcer which could also be the source of an infection. At this point we will continue IV antibiotics and admit for sepsis.  CRITICAL CARE Performed by: Minna AntisPADUCHOWSKI, Brylan Dec   Total critical care time: 45 minutes  Critical care time was exclusive of separately billable procedures and treating other patients.  Critical care was necessary to treat or prevent imminent or life-threatening deterioration.  Critical care was time spent personally by me on the following activities: development of treatment plan with patient and/or surrogate as well as nursing, discussions with consultants, evaluation of patient's response to treatment, examination of patient, obtaining history from patient or surrogate, ordering and performing treatments and interventions, ordering and review of  laboratory studies, ordering and review of radiographic studies, pulse oximetry and re-evaluation of patient's condition.   ____________________________________________   FINAL CLINICAL IMPRESSION(S) / ED DIAGNOSES  Sepsis Sacral ulcer UTI  Minna Antis, MD 07/17/15 1825

## 2015-07-17 NOTE — Clinical Social Work Note (Addendum)
Clinical Social Work Assessment  Patient Details  Name: Frank Robertson MRN: [id number] Date of Birth: 1926/08/05  Date of referral:  07/17/15               Reason for consult:  Facility Placement                Permission sought to share information with:  Family Supports, Magazine features editoracility Contact Representative Permission granted to share information::  Yes, Verbal Permission Granted  Name::     HCPOA-  Frank Robertson [phone]  Agency::  [facility]/ [facility]  Relationship::  family and facilities  Contact Information:  HCPOA-  Frank Robertson [phone]  Housing/Transportation Living arrangements for the past 2 months:  Skilled Nursing Facility Source of Information:  Adult Children Patient Interpreter Needed:  None Criminal Activity/Legal Involvement Pertinent to Current Situation/Hospitalization:  No - Comment as needed Significant Relationships:  Adult Children Lives with:  Facility Resident Do you feel safe going back to the place where you live?  Yes Need for family participation in patient care:  Yes (Comment)  Care giving concerns: Spoke with HCPOA and daughter stated no concerns   Office managerocial Worker assessment / plan:  LCSW called HCPOA and spoke to daughter Frank Robertson [phone]) [facility] and once finished his rehab he will return to his home [facility]( [place]) Patient has excellent family support. LCSW will conclude patients assessment [facility]. Patient has known history of Dementia, diabetes, history of previous TIA, BPH with urinary retention status post chronic indwelling Foley, hyperlipidemia who presents to the hospital from a skilled nursing facility due to altered mental status and weakness. Patient himself has advanced dementia and therefore most of the history obtained from the daughter at bedside. Full assistance with ADL's. [employer].  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[employer]) PT Recommendations:  Not assessed at this time Information / Referral to community resources:  Skilled Nursing Facility  Patient/Family's Response to care: [facility] and [facility]  Patient/Family's Understanding of and Emotional Response to Diagnosis, Current Treatment, and Prognosis: Good understanding  Emotional Assessment Appearance:  Appears stated age Attitude/Demeanor/Rapport:  Unable to Assess Affect (typically observed):  Unable to Assess, Calm, Afraid/Fearful Orientation:  Oriented to Self, Fluctuating Orientation (Suspected and/or reported Sundowners), Oriented to Place Alcohol / Substance use:  Never Used Psych involvement (Current and /or in the community):  No (Comment)  Discharge Needs  Concerns to be addressed:  No discharge needs identified Readmission within the last 30 days:   no Current discharge risk:  None Barriers to Discharge:  Continued Medical Work up   Cheron SchaumannBandi, Jazzy Parmer M, LCSW 07/17/2015, 5:34 PM

## 2015-07-17 NOTE — ED Notes (Addendum)
Lactic acid 4.7 

## 2015-07-17 NOTE — ED Notes (Signed)
Pt to ED via EMS [facility] with c/o labored breathing per facility. Pt was 99/59 with 120-130 HR en route. Pt has hx of dementia, pt alert.

## 2015-07-18 ENCOUNTER — Encounter: Payer: Self-pay | Admitting: *Deleted

## 2015-07-18 DIAGNOSIS — R451 Restlessness and agitation: Secondary | ICD-10-CM

## 2015-07-18 DIAGNOSIS — R627 Adult failure to thrive: Secondary | ICD-10-CM

## 2015-07-18 DIAGNOSIS — Z515 Encounter for palliative care: Secondary | ICD-10-CM

## 2015-07-18 DIAGNOSIS — A419 Sepsis, unspecified organism: Principal | ICD-10-CM

## 2015-07-18 DIAGNOSIS — Z66 Do not resuscitate: Secondary | ICD-10-CM

## 2015-07-18 LAB — URINE CULTURE: Culture: NO GROWTH

## 2015-07-18 LAB — VANCOMYCIN, RANDOM: VANCOMYCIN RM: 7

## 2015-07-18 LAB — BASIC METABOLIC PANEL
Anion gap: 6 (ref 5–15)
BUN: 98 mg/dL — ABNORMAL HIGH (ref 6–20)
CHLORIDE: 117 mmol/L — AB (ref 101–111)
CO2: 25 mmol/L (ref 22–32)
CREATININE: 2.43 mg/dL — AB (ref 0.61–1.24)
Calcium: 8.2 mg/dL — ABNORMAL LOW (ref 8.9–10.3)
GFR, EST AFRICAN AMERICAN: 26 mL/min — AB (ref >60)
GFR, EST NON AFRICAN AMERICAN: 22 mL/min — AB (ref >60)
Glucose, Bld: 156 mg/dL — ABNORMAL HIGH (ref 65–99)
POTASSIUM: 4.5 mmol/L (ref 3.5–5.1)
SODIUM: 148 mmol/L — AB (ref 135–145)

## 2015-07-18 LAB — GLUCOSE, CAPILLARY
GLUCOSE-CAPILLARY: 141 mg/dL — AB (ref 65–99)
Glucose-Capillary: 142 mg/dL — ABNORMAL HIGH (ref 65–99)

## 2015-07-18 LAB — CBC
HCT: 30.2 % — ABNORMAL LOW (ref 40.0–52.0)
Hemoglobin: 9.9 g/dL — ABNORMAL LOW (ref 13.0–18.0)
MCH: 27 pg (ref 26.0–34.0)
MCHC: 32.8 g/dL (ref 32.0–36.0)
MCV: 82.5 fL (ref 80.0–100.0)
PLATELETS: 244 10*3/uL (ref 150–440)
RBC: 3.67 MIL/uL — AB (ref 4.40–5.90)
RDW: 13.2 % (ref 11.5–14.5)
WBC: 15 10*3/uL — AB (ref 3.8–10.6)

## 2015-07-18 LAB — MAGNESIUM: Magnesium: 2 mg/dL (ref 1.7–2.4)

## 2015-07-18 LAB — MRSA PCR SCREENING: MRSA BY PCR: NEGATIVE

## 2015-07-18 MED ORDER — MORPHINE SULFATE (CONCENTRATE) 10 MG/0.5ML PO SOLN: 5.0000 mg | ORAL | Status: DC | PRN

## 2015-07-18 MED ORDER — BISACODYL 10 MG RE SUPP: 10.0000 mg | Freq: Every day | RECTAL | Status: DC | PRN

## 2015-07-18 MED ORDER — CHLORHEXIDINE GLUCONATE 0.12 % MT SOLN
15.0000 mL | Freq: Two times a day (BID) | OROMUCOSAL | Status: DC
  Administered 2015-07-18: 15 mL via OROMUCOSAL
  Filled 2015-07-18: qty 15

## 2015-07-18 MED ORDER — ZINC OXIDE 11.3 % EX CREA
1.0000 "application " | TOPICAL_CREAM | Freq: Two times a day (BID) | CUTANEOUS | Status: DC
  Filled 2015-07-18: qty 56

## 2015-07-18 MED ORDER — COLLAGENASE 250 UNIT/GM EX OINT
TOPICAL_OINTMENT | Freq: Every day | CUTANEOUS | Status: DC
  Filled 2015-07-18: qty 30

## 2015-07-18 MED ORDER — CETYLPYRIDINIUM CHLORIDE 0.05 % MT LIQD
7.0000 mL | Freq: Two times a day (BID) | OROMUCOSAL | Status: DC
  Administered 2015-07-18: 7 mL via OROMUCOSAL

## 2015-07-18 MED ORDER — SENNA 8.6 MG PO TABS: 1.0000 | ORAL_TABLET | Freq: Every day | ORAL | Status: DC

## 2015-07-18 MED ORDER — DOCUSATE SODIUM 100 MG PO CAPS: 200.0000 mg | ORAL_CAPSULE | Freq: Two times a day (BID) | ORAL | Status: DC

## 2015-07-18 MED ORDER — LORAZEPAM 1 MG PO TABS: 1.0000 mg | ORAL_TABLET | ORAL | Status: AC | PRN

## 2015-07-18 MED ORDER — PANTOPRAZOLE SODIUM 40 MG PO TBEC
40.0000 mg | DELAYED_RELEASE_TABLET | Freq: Every day | ORAL | Status: DC
Start: 2015-07-18 — End: 2015-07-18

## 2015-07-18 MED ORDER — MORPHINE SULFATE 20 MG/5ML PO SOLN: 10.0000 mg | ORAL | Status: AC | PRN

## 2015-07-18 MED ORDER — METOPROLOL TARTRATE 25 MG PO TABS: 25.0000 mg | ORAL_TABLET | Freq: Two times a day (BID) | ORAL | Status: DC

## 2015-07-18 MED ORDER — LORAZEPAM 1 MG PO TABS: 1.0000 mg | ORAL_TABLET | ORAL | Status: DC | PRN

## 2015-07-18 NOTE — Progress Notes (Signed)
Patient picked up by EMS and taken for follow up pallative care. Family at the bedside. Patient is alert to self, dressings to wounds dry and intact. Foley remained in place. Saline locks taken out site clean dry and intact.

## 2015-07-18 NOTE — Progress Notes (Signed)
   07/18/15 1500  Clinical Encounter Type  Visited With Patient and family together  Visit Type Initial;Spiritual support  Referral From Nurse  Consult/Referral To Chaplain  Spiritual Encounters  Spiritual Needs Prayer  Stress Factors  Patient Stress Factors Exhausted  Family Stress Factors Exhausted;Health changes;Major life changes  Advance Directives (For Healthcare)  Does patient have an advance directive? Yes  Patient will be placed in palliative. Prayed with patient and family.

## 2015-07-18 NOTE — Progress Notes (Signed)
Dr. Allena KatzPatel notified that patient is running beats of SVT. Dr. Allena KatzPatel stated that he will review the chart.

## 2015-07-18 NOTE — Progress Notes (Signed)
Initial Nutrition Assessment  DOCUMENTATION CODES:   Severe malnutrition in context of chronic illness  INTERVENTION:   -Per consult, pt on pureed (Dysphagia I diet order) [facility] thick [facility]; [facility]. Will adjust diet order accordingly. RD notes SLP evaluation pending. -Recommend Mighty Shakes on meal trays BID and Magic Cup TID for added nutrition (each supplement provides approximately 300kcals and 9g protein) -Pt may also benefit from Pro-stat on follow, will monitor intake and add accordingly. -Per RN currently holding po as pt not alert enough; provide assistance at all meal times when pt is appropriate.   NUTRITION DIAGNOSIS:   Malnutrition related to chronic illness as evidenced by severe depletion of muscle mass, severe depletion of body fat, percent weight loss, energy intake < or equal to 75% for > or equal to 1 month.  GOAL:   Patient will meet greater than or equal to 90% of their needs  MONITOR:   PO intake, Supplement acceptance, Labs, Weight trends, I & O's  REASON FOR ASSESSMENT:   Consult Assessment of nutrition requirement/status  ASSESSMENT:   Pt admitted with AMS and sepsis with multiple pressure ulcers.  Past Medical History  Diagnosis Date  . TIA (transient ischemic attack)   . Diabetes mellitus without complication (HCC)   . Dementia   . PVD (peripheral vascular disease) (HCC)   . BPH (benign prostatic hyperplasia)   . Hyperlipidemia   . Hypertension    Diet Order:  DIET - DYS 1 Room service appropriate?: No; Fluid consistency:: Nectar Thick   Pt non-communicative with RD on visit.   RD called Eber Jonesarolyn, Dietitian at [facility] who [facility] and that since pt has been eating little to nothing.  Pt requires assistance at all meal times and was on a dysphagia I diet order with Nectar thick liquids at baseline.    Medications: Colace, SS novolog, Remeron, Protonix,  Senna, Zc Oxide, NS at 16400mL/hr Labs: Na 148, BUN 98, Cre 2.43, Glucose 156   Gastrointestinal Profile: Last BM:  07/17/2015   Nutrition-Focused Physical Exam Findings: Nutrition-Focused physical exam completed. Findings are severe fat depletion, severe muscle depletion, and no edema.     Weight Change: [facility] weight of 141lbs on 07/10/2015 (8% weight loss in one week) Per consult pt with weight loss PTA. No family at bedside for further information.   Skin:   Multiple Stage I, II pressure ulcers   Height:   Ht Readings from Last 1 Encounters:  07/17/15 6' (1.829 m)    Weight:   Wt Readings from Last 1 Encounters:  07/17/15 130 lb 6.4 oz (59.149 kg)    Ideal Body Weight:   80kg  BMI:  Body mass index is 17.68 kg/(m^2).  Estimated Nutritional Needs:   Kcal:  2000-2400kcals  Protein:  96-120g protein  Fluid:  >/= 2L fluid  EDUCATION NEEDS:   Education needs no appropriate at this time  Leda QuailAllyson Tatisha Cerino, RD, LDN Pager [phone] Weekend/On-Call Pager [phone]

## 2015-07-18 NOTE — Consult Note (Signed)
WOC wound consult note Reason for Consult: Multiple pressure injury sites, present on admission.  Wound type: Pressureinjury Pressure Ulcer POA: Yes Measurement:Sacrum and left buttocks:  6 cm x 6.4 cm unable to visualize wound depth due to devitalized tissue.  Will begin enzymatic debridement.  Left heel:  Deep tissue injury:  4 cm x 4 cm intact maroon discoloration.  1 cm diameter maroon discoloration Left third and fourth digits to foot are denuded on dorsal surface.  Right lateral foot 1 cm x 2 cm maroon discoloration Moisture associated skin damage to posterior scrotum and penis.   DEvice related pressure injury to penile meatus from foley catheter.  Is secured at this time and not causing further trauma.   Wound ZOX:WRUEAVbed:Maroon  deep tissue injury Drainage (amount, consistency, odor) Minimal serosnaguinous  No odor.  Periwound:Some bruising present.  Dressing procedure/placement/frequency:Cleanse sacral wound with NS and pat gently dry.  Apply Santyl ointment to wound bed.  Cover with Allevyn silicone border foam dressing.  Reapply ointment daily.  Change silicone foam dressing Mon/Wed/Friday.  Prevalon boots to heel Will order mattress replacement with low air loss feature.  Silicone border foam dressing to left toes and bilateral heels.   Keep skin clean and dry.  No disposable briefs.  Barrier cream to perineal skin daily.  Will not follow at this time.  Please re-consult if needed.  Maple HudsonKaren Shenaya Lebo RN BSN CWON Pager [phone]

## 2015-07-18 NOTE — Progress Notes (Signed)
[facility] [place] [facility]                                                                                                                                                                                            Patient Demographics   Frank PinksJames Robertson, is a 82 y.o. male, DOB - 11/04/1926, ZOX:[id number]  Admit date - 07/17/2015   Admitting Physician Houston SirenVivek J Sainani, MD  Outpatient Primary MD for the patient is BABAOFF, Lavada MesiMARC E, MD   LOS - 1  Subjective:Patient with dementia [facility] with [facility] water and white decrease in responsiveness poor intake. Patient opens his eyes but unable to provide any review of systems     Review of Systems:   CONSTITUTIONAL: Dementia and unable to provide any review of systems  Vitals:   Filed Vitals:   07/17/15 2000 07/17/15 2046 07/18/15 0046 07/18/15 0439  BP:  158/92 129/48 99/47  Pulse:  120  102  Temp:  98.1 F (36.7 C)  98.2 F (36.8 C)  TempSrc:  Oral    Resp:  16  20  Height: 6' (1.829 m)     Weight: 59.149 kg (130 lb 6.4 oz)     SpO2:  98%  100%    Wt Readings from Last 3 Encounters:  07/17/15 59.149 kg (130 lb 6.4 oz)  07/21/14 77.111 kg (170 lb)     Intake/Output Summary (Last 24 hours) at 07/18/15 1131 Last data filed at 07/18/15 0500  Gross per 24 hour  Intake    686 ml  Output    650 ml  Net     36 ml    Physical Exam:   GENERAL: Chronically ill-appearing cachectic HEAD, EYES, EARS, NOSE AND THROAT: Atraumatic, normocephalic. Pupils equal and reactive to light. Sclerae anicteric. No conjunctival injection. No oro-pharyngeal erythema.  NECK: Supple. There is no jugular venous distention. No bruits, no lymphadenopathy, no thyromegaly.  HEART: Regular rate and rhythm,. No murmurs, no rubs, no clicks.  LUNGS: Clear to auscultation bilaterally. No rales or rhonchi. No wheezes.  ABDOMEN: Soft, flat, epigastric tenderness, nondistended. Has good bowel sounds. No  hepatosplenomegaly appreciated.  EXTREMITIES: No evidence of any cyanosis, clubbing, or peripheral edema.  +2 pedal and radial pulses bilaterally. Has ulcerations on his foot  NEUROLOGIC: Arousable not oriented to place person or time .  SKIN: Moist and warm with stage III decubitus ulcer Psych: Not anxious, depressed LN: No inguinal LN enlargement    Antibiotics   Anti-infectives    Start     Dose/Rate Route Frequency Ordered Stop   07/19/15 1800  levofloxacin (LEVAQUIN) IVPB 500 mg     500 mg 100 mL/hr over 60 Minutes Intravenous Every 48 hours 07/17/15 2115     07/18/15 0000  aztreonam (AZACTAM) 1 g in dextrose 5 % 50 mL IVPB     1 g 100 mL/hr over 30 Minutes Intravenous Every 8 hours 07/17/15 2137     07/17/15 1615  levofloxacin (LEVAQUIN) IVPB 750 mg     750 mg 100 mL/hr over 90 Minutes Intravenous  Once 07/17/15 1606 07/17/15 1758   07/17/15 1615  aztreonam (AZACTAM) 2 g in dextrose 5 % 50 mL IVPB     2 g 100 mL/hr over 30 Minutes Intravenous  Once 07/17/15 1606 07/17/15 1719   07/17/15 1615  vancomycin (VANCOCIN) IVPB 1000 mg/200 mL premix     1,000 mg 200 mL/hr over 60 Minutes Intravenous  Once 07/17/15 1606 07/17/15 1821      Medications   Scheduled Meds: . antiseptic oral rinse  7 mL Mouth Rinse q12n4p  . aspirin EC  81 mg Oral Daily  . aztreonam  1 g Intravenous Q8H  . chlorhexidine  15 mL Mouth Rinse BID  . collagenase   Topical Daily  . docusate sodium  200 mg Oral BID  . donepezil  5 mg Oral QHS  . finasteride  5 mg Oral Daily  . heparin subcutaneous  5,000 Units Subcutaneous Q12H  . insulin aspart  0-5 Units Subcutaneous QHS  . insulin aspart  0-9 Units Subcutaneous TID WC  . ketorolac  1 drop Right Eye TID  . [START ON 07/19/2015] levofloxacin (LEVAQUIN) IV  500 mg Intravenous Q48H  . metoprolol tartrate  25 mg Oral BID  . mirtazapine  15 mg Oral QHS  . pantoprazole  40 mg Oral Daily  . senna  1 tablet Oral Daily  . tamsulosin  0.4 mg Oral Daily  .  zinc oxide  1 application Topical BID   Continuous Infusions: . sodium chloride 1,000 mL (07/18/15 0852)   PRN Meds:.acetaminophen **OR** acetaminophen, ondansetron **OR** ondansetron (ZOFRAN) IV   Data Review:   Micro Results Recent Results (from the past 240 hour(s))  Blood Culture (routine x 2)     Status: None (Preliminary result)   Collection Time: 07/17/15  4:00 PM  Result Value Ref Range Status   Specimen Description BLOOD RIGHT WRIST  Final   Special Requests   Final    BOTTLES DRAWN AEROBIC AND ANAEROBIC  6CCAERO, 5CCANA   Culture NO GROWTH < 12 HOURS  Final   Report Status PENDING  Incomplete  Blood Culture (routine x 2)     Status: None (Preliminary result)   Collection Time: 07/17/15  4:10 PM  Result Value Ref Range Status   Specimen Description BLOOD LEFT HAND  Final   Special Requests   Final    BOTTLES DRAWN AEROBIC AND ANAEROBIC  7CCAERO, 8CCANA   Culture NO GROWTH < 12 HOURS  Final   Report Status PENDING  Incomplete  MRSA PCR Screening     Status: None   Collection Time: 07/17/15  9:34 PM  Result Value Ref Range Status   MRSA by PCR NEGATIVE NEGATIVE Final    Comment:        The GeneXpert MRSA Assay (FDA approved for NASAL specimens only), is one component of a comprehensive MRSA colonization surveillance program. It is not intended to diagnose MRSA infection nor to guide or monitor treatment for MRSA infections.     Radiology Reports Dg Chest [facility]  1 View  07/17/2015  CLINICAL DATA:  Tachypnea, local labored breathing EXAM: PORTABLE CHEST 1 VIEW COMPARISON:  09/11/2014 FINDINGS: There is no focal parenchymal opacity. There is no pleural effusion or pneumothorax. The heart and mediastinal contours are unremarkable. There is evidence of prior median sternotomy with aortic valve replacement. The osseous structures are unremarkable. IMPRESSION: No active disease. Electronically Signed   By: Elige Ko   On: 07/17/2015 16:20     CBC  Recent Labs Lab  07/17/15 1600 07/18/15 0403  WBC 20.0* 15.0*  HGB 11.8* 9.9*  HCT 36.3* 30.2*  PLT 304 244  MCV 82.4 82.5  MCH 26.9 27.0  MCHC 32.6 32.8  RDW 13.6 13.2  LYMPHSABS 2.0  --   MONOABS 1.4*  --   EOSABS 0.0  --   BASOSABS 0.3*  --     Chemistries   Recent Labs Lab 07/17/15 1600 07/18/15 0403  NA 144 148*  K 5.1 4.5  CL 108 117*  CO2 24 25  GLUCOSE 227* 156*  BUN 112* 98*  CREATININE 3.17* 2.43*  CALCIUM 9.0 8.2*  MG  --  2.0  AST 36  --   ALT 15*  --   ALKPHOS 75  --   BILITOT 0.5  --    ------------------------------------------------------------------------------------------------------------------ estimated creatinine clearance is 17.9 mL/min (by C-G formula based on Cr of 2.43). ------------------------------------------------------------------------------------------------------------------ No results for input(s): HGBA1C in the last 72 hours. ------------------------------------------------------------------------------------------------------------------ No results for input(s): CHOL, HDL, LDLCALC, TRIG, CHOLHDL, LDLDIRECT in the last 72 hours. ------------------------------------------------------------------------------------------------------------------ No results for input(s): TSH, T4TOTAL, T3FREE, THYROIDAB in the last 72 hours.  Invalid input(s): FREET3 ------------------------------------------------------------------------------------------------------------------ No results for input(s): VITAMINB12, FOLATE, FERRITIN, TIBC, IRON, RETICCTPCT in the last 72 hours.  Coagulation profile No results for input(s): INR, PROTIME in the last 168 hours.  No results for input(s): DDIMER in the last 72 hours.  Cardiac Enzymes  Recent Labs Lab 07/17/15 1600  TROPONINI 0.07*   ------------------------------------------------------------------------------------------------------------------ Invalid input(s): POCBNP    Assessment & Plan   82 year old  male with past medical history of dementia, BPH, hypertension, diabetes, history of previous TIA, peripheral vascular disease who presents to the hospital from a skilled nursing facility due to weakness, altered mental status.  1. Altered Encephalopathy due to urinary tract infection continue therapy with IV antibiotics  2. Sepsis-due to urinary tract infection continue IV anabiotic's with aztreonam  3. Urinary tract infection-suspected cause of patient's sepsis. -Continue broad-spectrum IV antibiotics as mentioned above. He was recently seen by urology as outpatient d/c levaquin  4. Stage 2-3 decubitus ulcer-await wound care team evaluation   5. Acute renal failure-secondary to sepsis, hypotension. -Improved continue IV fluids repeat BMP in the morning  6. Dementia-continue Aricept.  7. BPH status post chronic indwelling Foley-continue finasteride, Flomax. We will place barrier cream around the inflamed penis as recommended by urology which he saw one July 3  8. Leukocytosis-secondary to sepsis.  Continue aztreonam      Code Status Orders        Start     Ordered   07/17/15 2137  Do not attempt resuscitation (DNR)   Continuous    Question Answer Comment  In the event of cardiac or respiratory ARREST Do not call a "code blue"   In the event of cardiac or respiratory ARREST Do not perform Intubation, CPR, defibrillation or ACLS   In the event of cardiac or respiratory ARREST Use medication by any route, position, wound care, and other measures to relive  pain and suffering. May use oxygen, suction and manual treatment of airway obstruction as needed for comfort.      07/17/15 2136    Code Status History    Date Active Date Inactive Code Status Order ID Comments User Context   This patient has a current code status but no historical code status.    Advance Directive Documentation        Most Recent Value   Type of Advance Directive  Healthcare Power of Pollyann KennedyAttorney [also DNR]    Pre-existing out of facility DNR order (yellow form or pink MOST form)     "MOST" Form in Place?             Consults none  DVT Prophylaxis  heparin  Lab Results  Component Value Date   PLT 244 07/18/2015     Time Spent in minutes  35min  Greater than 50% of time spent in care coordination and counseling patient regarding the condition and plan of care.   Auburn BilberryPATEL, Kristoffer Bala M.D on 07/18/2015 at 11:31 AM  Between 7am to 6pm - Pager - [phone]  After 6pm go to [email or web address] - password [email or web address] [facility]  [facility]   Office  [phone]

## 2015-07-18 NOTE — Consult Note (Signed)
Consultation Note Date: 07/18/2015   Patient Name: Frank Robertson  DOB: August 16, 1926  MRN: [id number]  Age / Sex: 82 y.o., male  PCP: Kandyce RudMarcus Babaoff, MD Referring Physician: Auburn BilberryShreyang Patel, MD  Reason for Consultation: Establishing goals of care and Psychosocial/spiritual support  HPI/Patient Profile: 82 y.o. male   07/17/2015 with PMH of ES-dementia, diabetes, history of previous TIA, BPH with urinary retention status post chronic indwelling Foley, hyperlipidemia who presents to the hospital from a skilled nursing facility due to altered mental status and weakness.    As per the daughter patient with a past few days at the skilled nursing facility has been more lethargic, poor by mouth intake, complaining of some mild shortness of breath and altered. As he was not improving he was sent to the ER for further evaluation. In the emergency room patient was noted to be tachycardic, with an elevated lactate, initially hypotensive and also noted to have a leukocytosis. He was noted to have sepsis secondary to UTI or even a possibly infected decubitus ulcer.   Overall failure to thrive, continued physical, functional and cognitive decline.  Family faced with advanced directive decisions and anticipatory care needs  Clinical Assessment and Goals of Care:    This NP Lorinda CreedMary Jasara Corrigan reviewed medical records, received report from team, assessed the patient and then meet at the patient's bedside along with his daughter/HPOA/ Marcelina MorelJudy Russ to discuss diagnosis prognosis, GOC, EOL wishes disposition and options.   A detailed discussion was had today regarding advanced directives.  Concepts specific to code status, artifical feeding and hydration, continued IV antibiotics and rehospitalization was had.  The difference between a aggressive medical intervention path  and a palliative comfort care path for this patient at this time  was had.  Values and goals of care important to patient and family were attempted to be elicited.  Concept of Hospice and Palliative Care were discussed  Natural trajectory and expectations at EOL were discussed.  Questions and concerns addressed.  Hard Choices booklet left for review. Family encouraged to call with questions or concerns.  PMT will continue to support holistically.  MOST form completed       SUMMARY OF RECOMMENDATIONS    Code Status/Advance Care Planning:  DNR    Symptom Management:   Pain/Dyspnea: Roxanol 5 mg po/sl every 1 hr prn  Agitation: Ativan 1 mg po/sl every 4 hrs prn  Palliative Prophylaxis:   Aspiration, Bowel Regimen, Delirium Protocol, Frequent Pain Assessment and Oral Care  Additional Recommendations (Limitations, Scope, Preferences):  Full Comfort Care- no further diagnostics, artifical hydration, diagnostics, medications for comfort only  Psycho-social/Spiritual:   Desire for further Chaplaincy support:yes  Additional Recommendations: Education on Hospice  Prognosis:   < 2 weeks:   Discharge Planning: Hospice facility      Primary Diagnoses: Present on Admission:  . Sepsis (HCC)  I have reviewed the medical record, interviewed the patient and family, and examined the patient. The following aspects are pertinent.  Past Medical History  Diagnosis Date  . TIA (  transient ischemic attack)   . Diabetes mellitus without complication (HCC)   . Dementia   . PVD (peripheral vascular disease) (HCC)   . BPH (benign prostatic hyperplasia)   . Hyperlipidemia   . Hypertension    Social History   Social History  . Marital Status: Married    Spouse Name: N/A  . Number of Children: N/A  . Years of Education: N/A   Social History Main Topics  . Smoking status: Former Games developer  . Smokeless tobacco: None  . Alcohol Use: No  . Drug Use: None  . Sexual Activity: Not Asked   Other Topics Concern  . None   Social History  Narrative   Family History  Problem Relation Age of Onset  . Heart disease Father   . Cirrhosis Father    Scheduled Meds: . antiseptic oral rinse  7 mL Mouth Rinse q12n4p  . aspirin EC  81 mg Oral Daily  . aztreonam  1 g Intravenous Q8H  . chlorhexidine  15 mL Mouth Rinse BID  . collagenase   Topical Daily  . donepezil  5 mg Oral QHS  . feeding supplement (GLUCERNA SHAKE)  237 mL Oral BID  . finasteride  5 mg Oral Daily  . heparin subcutaneous  5,000 Units Subcutaneous Q12H  . insulin aspart  0-5 Units Subcutaneous QHS  . insulin aspart  0-9 Units Subcutaneous TID WC  . ketorolac  1 drop Right Eye TID  . [START ON 07/19/2015] levofloxacin (LEVAQUIN) IV  500 mg Intravenous Q48H  . mirtazapine  15 mg Oral QHS  . tamsulosin  0.4 mg Oral Daily   Continuous Infusions: . sodium chloride 100 mL/hr at 07/17/15 2230   PRN Meds:.acetaminophen **OR** acetaminophen, ondansetron **OR** ondansetron (ZOFRAN) IV Medications Prior to Admission:  Prior to Admission medications   Medication Sig Start Date End Date Taking? Authorizing Provider  acetaminophen (TYLENOL) 500 MG tablet Take 500 mg by mouth every 6 (six) hours as needed.   Yes Historical Provider, MD  aspirin EC 81 MG tablet Take 81 mg by mouth daily.   Yes Historical Provider, MD  bromfenac (XIBROM) 0.09 % ophthalmic solution Place 1 drop into the right eye 3 (three) times daily.   Yes Historical Provider, MD  donepezil (ARICEPT) 5 MG tablet Take 5 mg by mouth at bedtime.   Yes Historical Provider, MD  feeding supplement, GLUCERNA SHAKE, (GLUCERNA SHAKE) LIQD Take 237 mLs by mouth 2 (two) times daily.   Yes Historical Provider, MD  finasteride (PROSCAR) 5 MG tablet Take 5 mg by mouth daily.   Yes Historical Provider, MD  glipiZIDE (GLUCOTROL) 5 MG tablet Take 2.5 mg by mouth 2 (two) times daily.   Yes Historical Provider, MD  metFORMIN (GLUCOPHAGE) 1000 MG tablet Take 1 tablet by mouth 2 (two) times daily. 07/30/14  Yes Historical  Provider, MD  mirtazapine (REMERON) 15 MG tablet Take 15 mg by mouth at bedtime.   Yes Historical Provider, MD  tamsulosin (FLOMAX) 0.4 MG CAPS capsule Take 1 capsule by mouth daily. 07/30/14  Yes Historical Provider, MD   Allergies  Allergen Reactions  . Ace Inhibitors   . Fluvastatin Sodium Other (See Comments)  . Glimepiride Other (See Comments)  . Penicillins   . Saxagliptin Other (See Comments)  . Statins Other (See Comments)    Other Reaction: elevated LFTs  . Tramadol Other (See Comments)   Review of Systems  Unable to perform ROS: Dementia    Physical Exam  Constitutional: He appears  lethargic. He appears cachectic. He appears ill.  HENT:  Mouth/Throat: Mucous membranes are dry.  Pulmonary/Chest: He has decreased breath sounds in the right lower field and the left lower field.  Musculoskeletal:  -generalized muscle atrophy   Neurological: He appears lethargic.  Skin: Skin is warm and dry.    Vital Signs: BP 99/47 mmHg  Pulse 102  Temp(Src) 98.2 F (36.8 C) (Oral)  Resp 20  Ht 6' (1.829 m)  Wt 59.149 kg (130 lb 6.4 oz)  BMI 17.68 kg/m2  SpO2 100% Pain Assessment: PAINAD POSS *See Group Information*: 1-Acceptable,Awake and alert     SpO2: SpO2: 100 % O2 Device:SpO2: 100 % O2 Flow Rate: .O2 Flow Rate (L/min): 2 L/min  IO: Intake/output summary:  Intake/Output Summary (Last 24 hours) at 07/18/15 0824 Last data filed at 07/18/15 0500  Gross per 24 hour  Intake    686 ml  Output    650 ml  Net     36 ml    LBM: Last BM Date: 07/17/15 Baseline Weight: Weight: 59.149 kg (130 lb 6.4 oz) Most recent weight: Weight: 59.149 kg (130 lb 6.4 oz)      Palliative Assessment/Data: 20 %   Discussed with Dr Allena KatzPatel  Time In: 1300 Time Out: 1415 Time Total: 75 min  Greater than 50%  of this time was spent counseling and coordinating care related to the above assessment and plan.  Signed by: Lorinda CreedLARACH, Nelva Hauk, NP   Please contact Palliative Medicine Team phone at  [phone] for questions and concerns.  For individual provider: See Loretha StaplerAmion

## 2015-07-18 NOTE — Clinical Social Work Note (Signed)
Patient to discharge to hospice home today. CSW has prepared hospice home packet. Hospice home liason arranging discharge and transportation. York SpanielMonica Thailyn Khalid MSW,LCSW [phone]

## 2015-07-18 NOTE — Discharge Summary (Signed)
Frank Robertson, 82 y.o., DOB 1926-08-29, MRN [id number]. Admission date: 07/17/2015 Discharge Date 07/18/2015 Primary MD BABAOFF, Lavada MesiMARC E, MD Admitting Physician Houston SirenVivek J Sainani, MD  Admission Diagnosis  UTI (lower urinary tract infection) [N39.0] Sacral ulcer, unspecified pressure ulcer stage [L89.159] Sepsis, due to unspecified organism [facility](HCC) [A41.9]  Discharge Diagnosis   Active Problems:   Sepsis (HCC) due to uti   DNR (do not resuscitate)   Palliative care encounter   Adult failure to thrive syndrome   Agitation   Acute encephalopathy   Acute renal failure   Dementia   bph      Hospital Course  [facility] with dementia [facility] with UTI water and white decrease in responsiveness poor intake. Patient was started on antibiotics. He remains same, due to recent hospitalization, dementia failure to improve. Pallative care consult was obtained. Pt was made comfort care and hospices has accepted patient.           Consults  None  Significant Tests:  See full reports for all details      Dg Chest Port 1 View  07/17/2015  CLINICAL DATA:  Tachypnea, local labored breathing EXAM: PORTABLE CHEST 1 VIEW COMPARISON:  09/11/2014 FINDINGS: There is no focal parenchymal opacity. There is no pleural effusion or pneumothorax. The heart and mediastinal contours are unremarkable. There is evidence of prior median sternotomy with aortic valve replacement. The osseous structures are unremarkable. IMPRESSION: No active disease. Electronically Signed   By: Elige KoHetal  Talisa Petrak   On: 07/17/2015 16:20       TData Review     CBC w Diff: Lab Results  Component Value Date   WBC 15.0* 07/18/2015   HGB 9.9* 07/18/2015   HCT 30.2* 07/18/2015   PLT 244 07/18/2015   LYMPHOPCT 10 07/17/2015   MONOPCT 7 07/17/2015   EOSPCT 0 07/17/2015   BASOPCT 2 07/17/2015   CMP: Lab Results  Component Value Date   NA 148* 07/18/2015   K 4.5 07/18/2015   CL 117* 07/18/2015   CO2 25  07/18/2015   BUN 98* 07/18/2015   CREATININE 2.43* 07/18/2015   PROT 6.7 07/17/2015   ALBUMIN 2.5* 07/17/2015   BILITOT 0.5 07/17/2015   ALKPHOS 75 07/17/2015   AST 36 07/17/2015   ALT 15* 07/17/2015  .  Micro Results Recent Results (from the past 240 hour(s))  Blood Culture (routine x 2)     Status: None (Preliminary result)   Collection Time: 07/17/15  4:00 PM  Result Value Ref Range Status   Specimen Description BLOOD RIGHT WRIST  Final   Special Requests   Final    BOTTLES DRAWN AEROBIC AND ANAEROBIC  6CCAERO, 5CCANA   Culture NO GROWTH < 12 HOURS  Final   Report Status PENDING  Incomplete  Blood Culture (routine x 2)     Status: None (Preliminary result)   Collection Time: 07/17/15  4:10 PM  Result Value Ref Range Status   Specimen Description BLOOD LEFT HAND  Final   Special Requests   Final    BOTTLES DRAWN AEROBIC AND ANAEROBIC  7CCAERO, 8CCANA   Culture NO GROWTH < 12 HOURS  Final   Report Status PENDING  Incomplete  MRSA PCR Screening     Status: None   Collection Time: 07/17/15  9:34 PM  Result Value Ref Range Status   MRSA by PCR NEGATIVE NEGATIVE Final    Comment:        The GeneXpert MRSA Assay (FDA approved for  NASAL specimens only), is one component of a comprehensive MRSA colonization surveillance program. It is not intended to diagnose MRSA infection nor to guide or monitor treatment for MRSA infections.         Code Status Orders        Start     Ordered   07/17/15 2137  Do not attempt resuscitation (DNR)   Continuous    Question Answer Comment  In the event of cardiac or respiratory ARREST Do not call a "code blue"   In the event of cardiac or respiratory ARREST Do not perform Intubation, CPR, defibrillation or ACLS   In the event of cardiac or respiratory ARREST Use medication by any route, position, wound care, and other measures to relive pain and suffering. May use oxygen, suction and manual treatment of airway obstruction as needed  for comfort.      07/17/15 2136    Code Status History    Date Active Date Inactive Code Status Order ID Comments User Context   This patient has a current code status but no historical code status.    Advance Directive Documentation        Most Recent Value   Type of Advance Directive  Healthcare Power of Pollyann KennedyAttorney [also DNR]   Pre-existing out of facility DNR order (yellow form or pink MOST form)     "MOST" Form in Place?              Discharge Medications     Medication List    STOP taking these medications        acetaminophen 500 MG tablet  Commonly known as:  TYLENOL     aspirin EC 81 MG tablet     bromfenac 0.09 % ophthalmic solution  Commonly known as:  XIBROM     donepezil 5 MG tablet  Commonly known as:  ARICEPT     feeding supplement (GLUCERNA SHAKE) Liqd     finasteride 5 MG tablet  Commonly known as:  PROSCAR     glipiZIDE 5 MG tablet  Commonly known as:  GLUCOTROL     metFORMIN 1000 MG tablet  Commonly known as:  GLUCOPHAGE     mirtazapine 15 MG tablet  Commonly known as:  REMERON     tamsulosin 0.4 MG Caps capsule  Commonly known as:  FLOMAX      TAKE these medications        LORazepam 1 MG tablet  Commonly known as:  ATIVAN  Take 1 tablet (1 mg total) by mouth every 4 (four) hours as needed for anxiety.     morphine 20 MG/5ML solution  Take 2.5 mLs (10 mg total) by mouth every 2 (two) hours as needed for pain.           Total Time in preparing paper work, data evaluation and todays exam - 35 minutes  Auburn BilberryPATEL, Jalana Moore M.D on 07/18/2015 at 2:53 PM  [facility]   Office  [phone]

## 2015-07-18 NOTE — Consult Note (Signed)
   [facility] Consult   07/18/2015  [facility] 02/20/1926 [id number]   Patient screened for potential [facility] Care Management services. Patient is eligible for [facility] Services. Electronic medical record reveals patient's plan is comfort care and there were no identifiable [facility] care [facility] needs at this time. [facility] services not appropriate at this time. If patient's post hospital needs change please place a [facility] Care [facility] consult. For questions please contact:   Virginia Francisco RN, BSN [facility]  Hospital Liaison  [phone]) Business Mobile [phone]) Toll free office

## 2015-07-18 NOTE — Progress Notes (Signed)
[facility] [place] referral received from [place] following a Palliative Medicine Consult with NP Wadie Lessen. Mr. Mcewan is an 82 year old man admitted to [facility] on 7/6 for evaluation and treatment of altered mental status. He was found to be septic, suspected to be from a UTI as well as decubitus ulcers. He has a PMH of dementia, DM II, HLD, TIA, HTN, PVD, BPH and osteoarthritis. Per chart review he has had a decline in functional status over the past months as well as weight loss. Current BMI 17, albumin 2.5. Family met today with NP Wadie Lessen and have chosen to focus on patient's comfort at the hospice home. Patient seen lying in bed, alert, wife and daughter present at bedside. Patient slow to respond, appears uncomfortable. Per staff report he calls out with any movement of touch. Writer met in the family room with patient's daughter and HCPOA Horton Chin to initiate education regarding hospice services, philosophy and team approach to care with good understanding voiced. Plan is for transfer to the [facility] today via EMS with signed  portable DNR and MOST form in place. Hospital care team and family all aware of and in agreement with plan. Patient information faxed to referral, report called to the Hospice Home. EMS notified for transport. Thank you for the opportunity to be involved in the care of this patient and his family. Flo Shanks RN, BSN, [place] [facility] [place], [facility] [phone] c

## 2015-07-18 NOTE — Evaluation (Signed)
Clinical/Bedside Swallow Evaluation Patient Details  Name: Frank HawthorneJames William Robertson MRN: [id number] Date of Birth: 1926-08-02  Today's Date: 07/18/2015 Time: SLP Start Time (ACUTE ONLY): 1000 SLP Stop Time (ACUTE ONLY): 1100 SLP Time Calculation (min) (ACUTE ONLY): 60 min  Past Medical History:  Past Medical History  Diagnosis Date  . TIA (transient ischemic attack)   . Diabetes mellitus without complication (HCC)   . Dementia   . PVD (peripheral vascular disease) (HCC)   . BPH (benign prostatic hyperplasia)   . Hyperlipidemia   . Hypertension    Past Surgical History: History reviewed. No pertinent past surgical history. HPI:  Pt is a 82 y.o. male with a known history of Dementia, diabetes, history of previous TIA, BPH with urinary retention status post chronic indwelling Foley, hyperlipidemia who presents to the hospital from a skilled nursing facility due to altered mental status and weakness. Patient himself has advanced dementia and therefore most of the history obtained from the daughter at bedside. As per the daughter patient with a past few days at the skilled nursing facility has been more lethargic, poor by mouth intake, complaining of some mild shortness of breath and altered. As he was not improving he was sent to the ER for further evaluation. Pt was nonverbal during this exam but did nod to SLP some wants and needs.    Assessment / Plan / Recommendation Clinical Impression  Pt appeared to adequately tolerate trials of Nectar consistency liquids and puree following general aspiration precautions. Pt required full feeding support and only wanted a few trials total then shook his head "no" to offers of more. No oral phase deficits noted w/ the boluses of puree and Nectar liquids; adequate transfer and oral clearing noted. No overt s/s of aspiration noted w/ these trial consistencies as well; no change in respiratory pattern/effort post trials. No other consistencies assessed at this  eval secondary to pt's positioning and presentation of fatigue/declined state. Recommend a Dysphagia 1 diet w/ Nectar liquids (this may be his baseline diet at the NH per report after evaluation was completed). Recommend aspiration precautions and meds in puree - Crushed as able. ST will f/u to monitor toleration of diet while admitted if indicated. NSG updated.     Aspiration Risk  Mild aspiration risk (-Moderate risk)    Diet Recommendation  Dysphagia 1 w/ Nectar liquids; general aspiration precautions; feeding assistance  Medication Administration: Crushed with puree    Other  Recommendations Recommended Consults:  (Dietician; palliative care consult) Oral Care Recommendations: Oral care BID;Staff/trained caregiver to provide oral care Other Recommendations: Order thickener from pharmacy;Prohibited food (jello, ice cream, thin soups);Remove water pitcher   Follow up Recommendations  Skilled Nursing facility    Frequency and Duration            Prognosis Prognosis for Safe Diet Advancement: Fair Barriers to Reach Goals: Cognitive deficits      Swallow Study   General Date of Onset: 07/17/15 HPI: Pt is a 82 y.o. male with a known history of Dementia, diabetes, history of previous TIA, BPH with urinary retention status post chronic indwelling Foley, hyperlipidemia who presents to the hospital from a skilled nursing facility due to altered mental status and weakness. Patient himself has advanced dementia and therefore most of the history obtained from the daughter at bedside. As per the daughter patient with a past few days at the skilled nursing facility has been more lethargic, poor by mouth intake, complaining of some mild shortness of breath and altered.  As he was not improving he was sent to the ER for further evaluation. Pt was nonverbal during this exam but did nod to SLP some wants and needs.  Type of Study: Bedside Swallow Evaluation Previous Swallow Assessment: none  indicated Diet Prior to this Study:  (unknown) Temperature Spikes Noted: No (WBC elevated) Respiratory Status: Nasal cannula (2 liters) History of Recent Intubation: No Behavior/Cognition: Cooperative;Confused;Distractible;Requires cueing Oral Cavity Assessment:  (difficult to assess sec. to Cognitive decline) Oral Care Completed by SLP: Yes (attempted) Oral Cavity - Dentition: Edentulous Vision:  (n/a) Self-Feeding Abilities: Total assist Patient Positioning: Postural control adequate for testing Baseline Vocal Quality:  (nonverbal) Volitional Cough: Cognitively unable to elicit Volitional Swallow: Unable to elicit    Oral/Motor/Sensory Function Overall Oral Motor/Sensory Function:  (difficult to assess; adequate w/ bolus management)   Ice Chips Ice chips: Not tested   Thin Liquid Thin Liquid: Not tested    Nectar Thick Nectar Thick Liquid: Within functional limits Presentation: Spoon;Straw (fed; 1 trial via spoon, 3 trials via straw)   Honey Thick Honey Thick Liquid: Not tested   Puree Puree: Within functional limits Presentation: Spoon (fed; 3 trials)   Solid   GO   Solid: Not tested         Jerilynn SomKatherine Watson, MS, CCC-SLP  Watson,Katherine 07/18/2015,2:09 PM

## 2015-07-18 NOTE — Discharge Instructions (Signed)
°  DIET:  As tolerted  DISCHARGE CONDITION:  Critical  ACTIVITY:  Activity as tolerated  OXYGEN:  Home Oxygen: No.   Oxygen Delivery: room air  DISCHARGE LOCATION:  Hospices home    ADDITIONAL DISCHARGE INSTRUCTION:   If you experience worsening of your admission symptoms, develop shortness of breath, life threatening emergency, suicidal or homicidal thoughts you must seek medical attention immediately by calling 911 or calling your MD immediately  if symptoms less severe.  You Must read complete instructions/literature along with all the possible adverse reactions/side effects for all the Medicines you take and that have been prescribed to you. Take any new Medicines after you have completely understood and accpet all the possible adverse reactions/side effects.   Please note  You were cared for by a hospitalist during your hospital stay. If you have any questions about your discharge medications or the care you received while you were in the hospital after you are discharged, you can call the unit and asked to speak with the hospitalist on call if the hospitalist that took care of you is not available. Once you are discharged, your primary care physician will handle any further medical issues. Please note that NO REFILLS for any discharge medications will be authorized once you are discharged, as it is imperative that you return to your primary care physician (or establish a relationship with a primary care physician if you do not have one) for your aftercare needs so that they can reassess your need for medications and monitor your lab values.

## 2015-07-18 NOTE — NC FL2 (Signed)
[place] MEDICAID FL2 LEVEL OF CARE SCREENING TOOL     IDENTIFICATION  Patient Name: Frank Robertson Birthdate: 08-16-1926 Sex: male Admission Date (Current Location): 07/17/2015  [place]   Facility and Address:  [facility], [address], [place], [place] [address]      Provider Number: [id number]  Attending Physician Name and Address:  Auburn BilberryShreyang Patel, MD  Relative Name and Phone Number:       Current Level of Care: Hospital Recommended Level of Care: Skilled Nursing Facility Prior Approval Number:    Date Approved/Denied:   PASRR Number:    Discharge Plan: SNF    Current Diagnoses: Patient Active Problem List   Diagnosis Date Noted  . Sepsis (HCC) 07/17/2015    Orientation RESPIRATION BLADDER Height & Weight     Self, Time  Normal Indwelling catheter Weight: 130 lb 6.4 oz (59.149 kg) Height:  6' (182.9 cm)  BEHAVIORAL SYMPTOMS/MOOD NEUROLOGICAL BOWEL NUTRITION STATUS      Incontinent Diet (Diabetic)  AMBULATORY STATUS COMMUNICATION OF NEEDS Skin   Extensive Assist Verbally Skin abrasions                       Personal Care Assistance Level of Assistance  Bathing, Feeding, Dressing, Total care Bathing Assistance: Maximum assistance Feeding assistance: Limited assistance Dressing Assistance: Limited assistance Total Care Assistance: Limited assistance   Functional Limitations Info  Sight, Hearing, Speech Sight Info: Adequate Hearing Info: Adequate Speech Info: Adequate    SPECIAL CARE FACTORS FREQUENCY  PT (By licensed PT), OT (By licensed OT)   Diabetic- Insulin sliding scale     PT Frequency: x5 OT Frequency: x5            Contractures      Additional Factors Info  Code Status Code Status Info: DNR             Current Medications (07/18/2015):  This is the current hospital active medication list Current Facility-Administered Medications  Medication Dose Route  Frequency Provider Last Rate Last Dose  . 0.9 %  sodium chloride infusion   Intravenous Continuous Houston SirenVivek J Sainani, MD 100 mL/hr at 07/17/15 2230    . acetaminophen (TYLENOL) tablet 650 mg  650 mg Oral Q6H PRN Houston SirenVivek J Sainani, MD       Or  . acetaminophen (TYLENOL) suppository 650 mg  650 mg Rectal Q6H PRN Houston SirenVivek J Sainani, MD   650 mg at 07/17/15 2318  . antiseptic oral rinse (CPC / CETYLPYRIDINIUM CHLORIDE 0.05%) solution 7 mL  7 mL Mouth Rinse q12n4p Houston SirenVivek J Sainani, MD      . aspirin EC tablet 81 mg  81 mg Oral Daily Houston SirenVivek J Sainani, MD   81 mg at 07/17/15 2148  . aztreonam (AZACTAM) 1 g in dextrose 5 % 50 mL IVPB  1 g Intravenous Q8H Houston SirenVivek J Sainani, MD   1 g at 07/18/15 0039  . chlorhexidine (PERIDEX) 0.12 % solution 15 mL  15 mL Mouth Rinse BID Houston SirenVivek J Sainani, MD   15 mL at 07/18/15 0039  . donepezil (ARICEPT) tablet 5 mg  5 mg Oral QHS Houston SirenVivek J Sainani, MD   5 mg at 07/17/15 2305  . feeding supplement (GLUCERNA SHAKE) (GLUCERNA SHAKE) liquid 237 mL  237 mL Oral BID Houston SirenVivek J Sainani, MD   237 mL at 07/17/15 2200  . finasteride (PROSCAR) tablet 5 mg  5 mg Oral Daily Houston SirenVivek J Sainani, MD      .  heparin injection 5,000 Units  5,000 Units Subcutaneous Q12H Houston SirenVivek J Sainani, MD   5,000 Units at 07/17/15 2306  . insulin aspart (novoLOG) injection 0-5 Units  0-5 Units Subcutaneous QHS Houston SirenVivek J Sainani, MD   0 Units at 07/17/15 2200  . insulin aspart (novoLOG) injection 0-9 Units  0-9 Units Subcutaneous TID WC Houston SirenVivek J Sainani, MD      . ketorolac (ACULAR) 0.5 % ophthalmic solution 1 drop  1 drop Right Eye TID Houston SirenVivek J Sainani, MD   1 drop at 07/17/15 2307  . [START ON 07/19/2015] levofloxacin (LEVAQUIN) IVPB 500 mg  500 mg Intravenous Q48H Houston SirenVivek J Sainani, MD      . mirtazapine (REMERON) tablet 15 mg  15 mg Oral QHS Houston SirenVivek J Sainani, MD   15 mg at 07/17/15 2305  . ondansetron (ZOFRAN) tablet 4 mg  4 mg Oral Q6H PRN Houston SirenVivek J Sainani, MD       Or  . ondansetron (ZOFRAN) injection 4 mg  4 mg Intravenous Q6H  PRN Houston SirenVivek J Sainani, MD      . tamsulosin (FLOMAX) capsule 0.4 mg  0.4 mg Oral Daily Houston SirenVivek J Sainani, MD         Discharge Medications: Please see discharge summary for a list of discharge medications.  Relevant Imaging Results:  Relevant Lab Results:   Additional Information [id number]  Cheron SchaumannBandi, Santiago Graf M, [place]

## 2015-07-18 NOTE — Progress Notes (Signed)
Dr. Joneen Roachrosley notified of tachycardia with one episode of 154, resumed to 100, irregular; Patient asymptomatic. Windy Carinaurner,Teresha Hanks K, RN 1:11 AM 07/18/2015

## 2015-07-18 NOTE — [place] FL2 (Signed)
[place] MEDICAID FL2 LEVEL OF CARE SCREENING TOOL     IDENTIFICATION  Patient Name: Frank Robertson Birthdate: [birth year] Sex: male Admission Date (Current Location): 07/17/2015  [place]   Facility and Address:  [facility], [address], [place], [place] [address]      Provider Number: [id number]  Attending Physician Name and Address:  Auburn BilberryShreyang Patel, MD  Relative Name and Phone Number:       Current Level of Care: Hospital Recommended Level of Care: Skilled Nursing Facility Prior Approval Number:    Date Approved/Denied:   PASRR Number:  [id number]  Discharge Plan: SNF    Current Diagnoses: Patient Active Problem List   Diagnosis Date Noted  . Sepsis (HCC) 07/17/2015    Orientation RESPIRATION BLADDER Height & Weight     Self, Time  Normal Indwelling catheter Weight: 130 lb 6.4 oz (59.149 kg) Height:  6' (182.9 cm)  BEHAVIORAL SYMPTOMS/MOOD NEUROLOGICAL BOWEL NUTRITION STATUS      Incontinent Diet (Diabetic)  AMBULATORY STATUS COMMUNICATION OF NEEDS Skin   Extensive Assist Verbally Skin abrasions                       Personal Care Assistance Level of Assistance  Bathing, Feeding, Dressing, Total care Bathing Assistance: Maximum assistance Feeding assistance: Limited assistance Dressing Assistance: Limited assistance Total Care Assistance: Limited assistance   Functional Limitations Info  Sight, Hearing, Speech Sight Info: Adequate Hearing Info: Adequate Speech Info: Adequate    SPECIAL CARE FACTORS FREQUENCY  PT (By licensed PT), OT (By licensed OT)     PT Frequency: x5 OT Frequency: x5            Contractures      Additional Factors Info  Code Status Code Status Info: DNR             Current Medications (07/18/2015):  This is the current hospital active medication list Current Facility-Administered Medications  Medication Dose Route Frequency Provider Last Rate  Last Dose  . 0.9 %  sodium chloride infusion   Intravenous Continuous Houston SirenVivek J Sainani, MD 100 mL/hr at 07/17/15 2230    . acetaminophen (TYLENOL) tablet 650 mg  650 mg Oral Q6H PRN Houston SirenVivek J Sainani, MD       Or  . acetaminophen (TYLENOL) suppository 650 mg  650 mg Rectal Q6H PRN Houston SirenVivek J Sainani, MD   650 mg at 07/17/15 2318  . antiseptic oral rinse (CPC / CETYLPYRIDINIUM CHLORIDE 0.05%) solution 7 mL  7 mL Mouth Rinse q12n4p Houston SirenVivek J Sainani, MD      . aspirin EC tablet 81 mg  81 mg Oral Daily Houston SirenVivek J Sainani, MD   81 mg at 07/17/15 2148  . aztreonam (AZACTAM) 1 g in dextrose 5 % 50 mL IVPB  1 g Intravenous Q8H Houston SirenVivek J Sainani, MD   1 g at 07/18/15 0039  . chlorhexidine (PERIDEX) 0.12 % solution 15 mL  15 mL Mouth Rinse BID Houston SirenVivek J Sainani, MD   15 mL at 07/18/15 0039  . donepezil (ARICEPT) tablet 5 mg  5 mg Oral QHS Houston SirenVivek J Sainani, MD   5 mg at 07/17/15 2305  . feeding supplement (GLUCERNA SHAKE) (GLUCERNA SHAKE) liquid 237 mL  237 mL Oral BID Houston SirenVivek J Sainani, MD   237 mL at 07/17/15 2200  . finasteride (PROSCAR) tablet 5 mg  5 mg Oral Daily Houston SirenVivek J Sainani, MD      .  heparin injection 5,000 Units  5,000 Units Subcutaneous Q12H Houston SirenVivek J Sainani, MD   5,000 Units at 07/17/15 2306  . insulin aspart (novoLOG) injection 0-5 Units  0-5 Units Subcutaneous QHS Houston SirenVivek J Sainani, MD   0 Units at 07/17/15 2200  . insulin aspart (novoLOG) injection 0-9 Units  0-9 Units Subcutaneous TID WC Houston SirenVivek J Sainani, MD      . ketorolac (ACULAR) 0.5 % ophthalmic solution 1 drop  1 drop Right Eye TID Houston SirenVivek J Sainani, MD   1 drop at 07/17/15 2307  . [START ON 07/19/2015] levofloxacin (LEVAQUIN) IVPB 500 mg  500 mg Intravenous Q48H Houston SirenVivek J Sainani, MD      . mirtazapine (REMERON) tablet 15 mg  15 mg Oral QHS Houston SirenVivek J Sainani, MD   15 mg at 07/17/15 2305  . ondansetron (ZOFRAN) tablet 4 mg  4 mg Oral Q6H PRN Houston SirenVivek J Sainani, MD       Or  . ondansetron (ZOFRAN) injection 4 mg  4 mg Intravenous Q6H PRN Houston SirenVivek J Sainani, MD      .  tamsulosin (FLOMAX) capsule 0.4 mg  0.4 mg Oral Daily Houston SirenVivek J Sainani, MD         Discharge Medications: Please see discharge summary for a list of discharge medications.  Relevant Imaging Results:  Relevant Lab Results:   Additional Information SSN [id number]  Cheron SchaumannBandi, Aziya Arena M, [place]

## 2015-07-18 NOTE — Progress Notes (Signed)
Patient made comfort care 

## 2015-07-22 LAB — CULTURE, BLOOD (ROUTINE X 2)
CULTURE: NO GROWTH
Culture: NO GROWTH

## 2015-08-12 DEATH — deceased

## 2016-05-29 IMAGING — CR DG HIP (WITH OR WITHOUT PELVIS) 2-3V*R*
3 series · 3 of 3 positions shown · non-contrast
Comparison: Pelvis 04/16/2014

CLINICAL DATA: Patient was in the bathroom and felt like he was
going to fall. He tried to grab the wall and ended up falling into
the bathtub. Pain mid femur.

EXAM:
DG HIP (WITH OR WITHOUT PELVIS) 2-3V RIGHT

[pelvis ap]
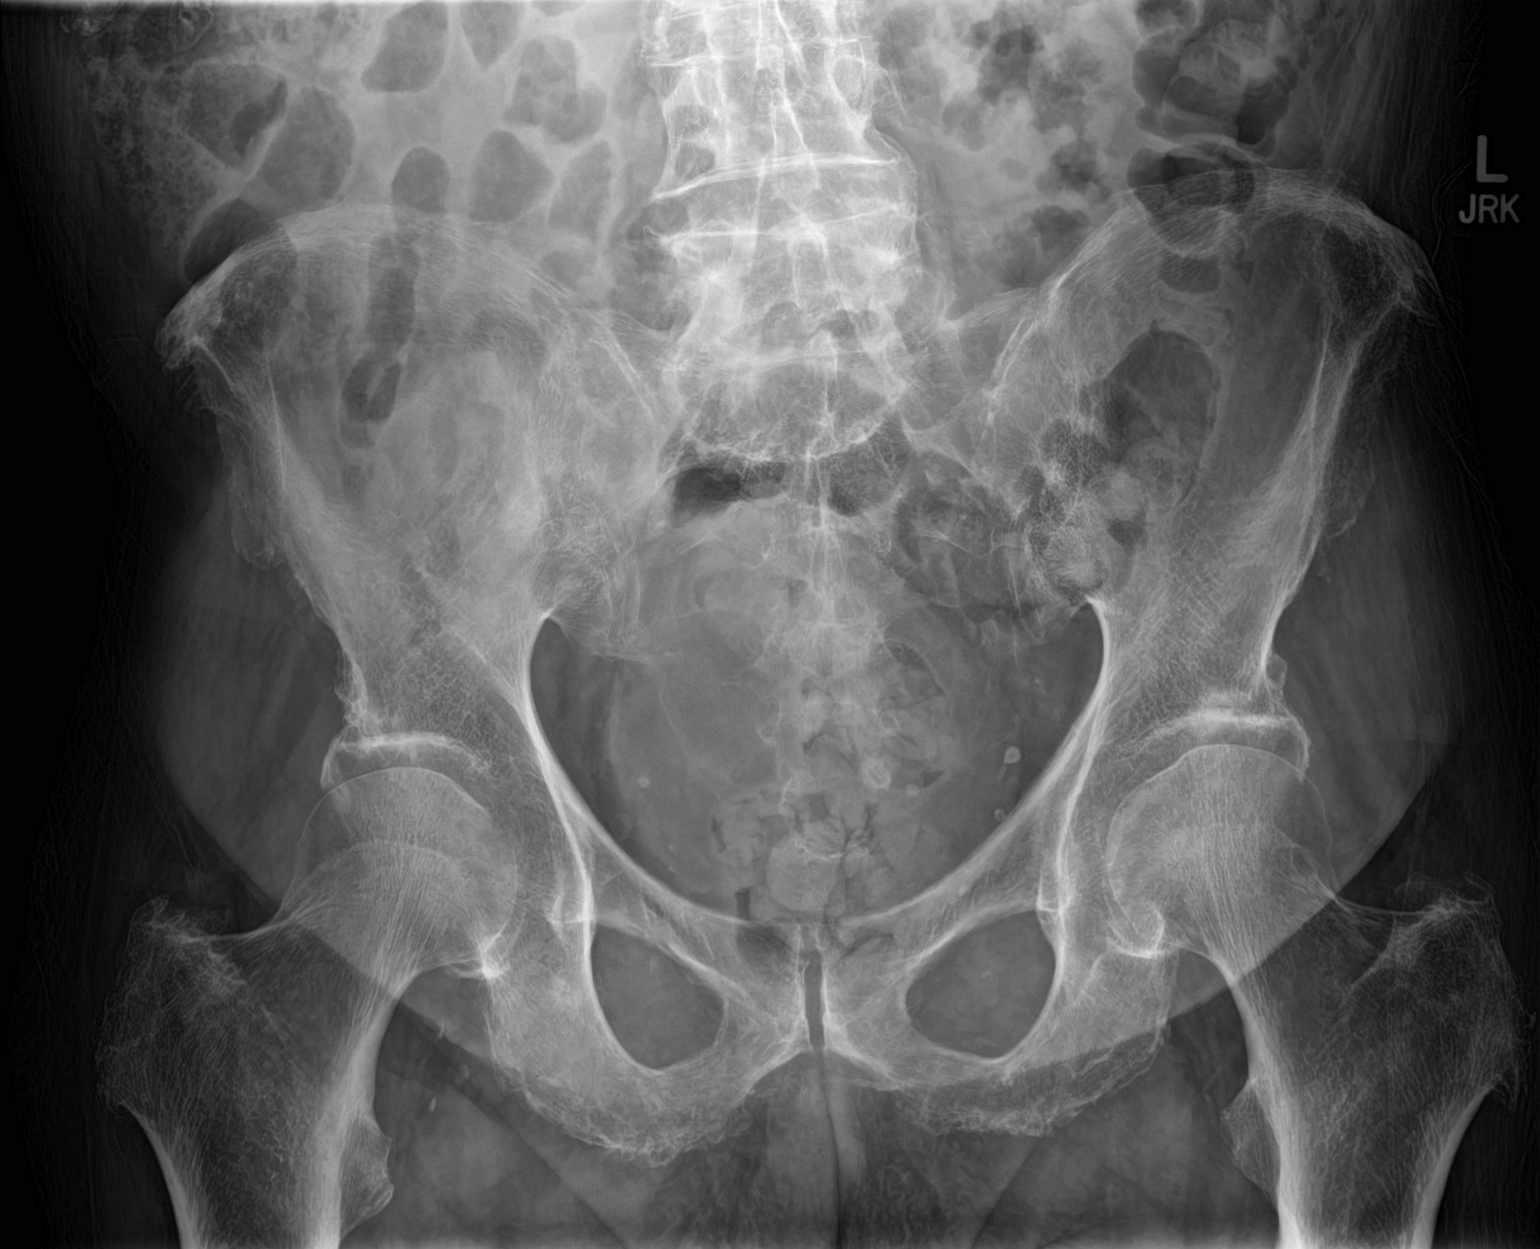

[hip ap]
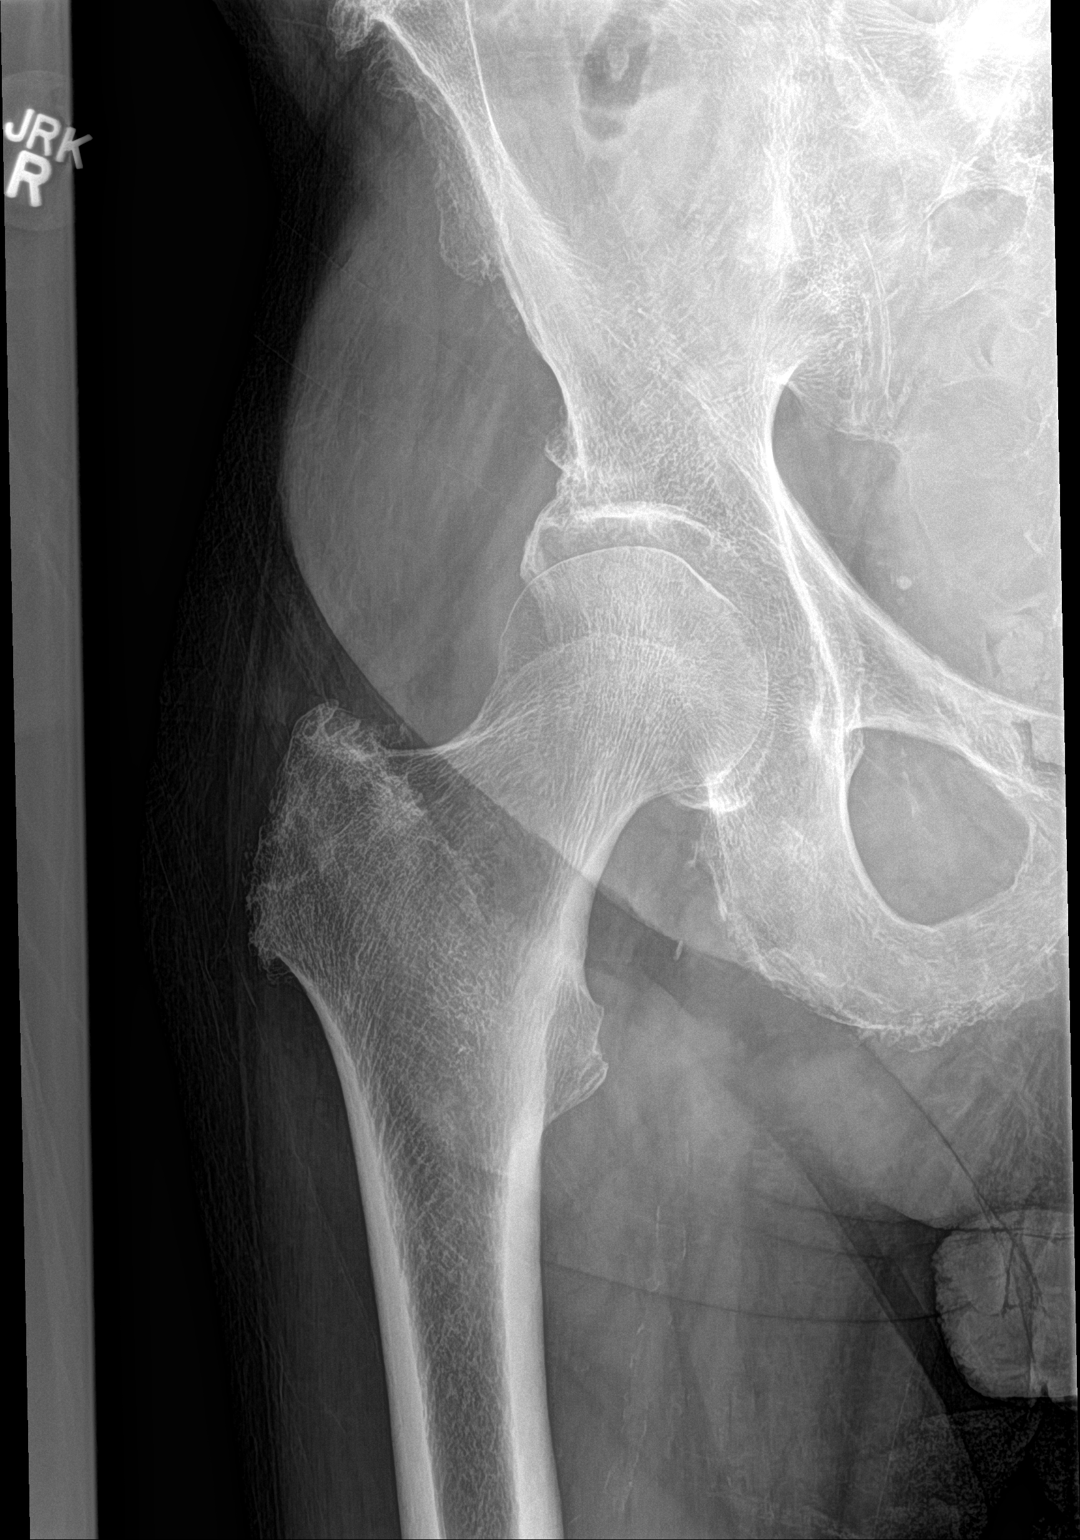

[hip lat]
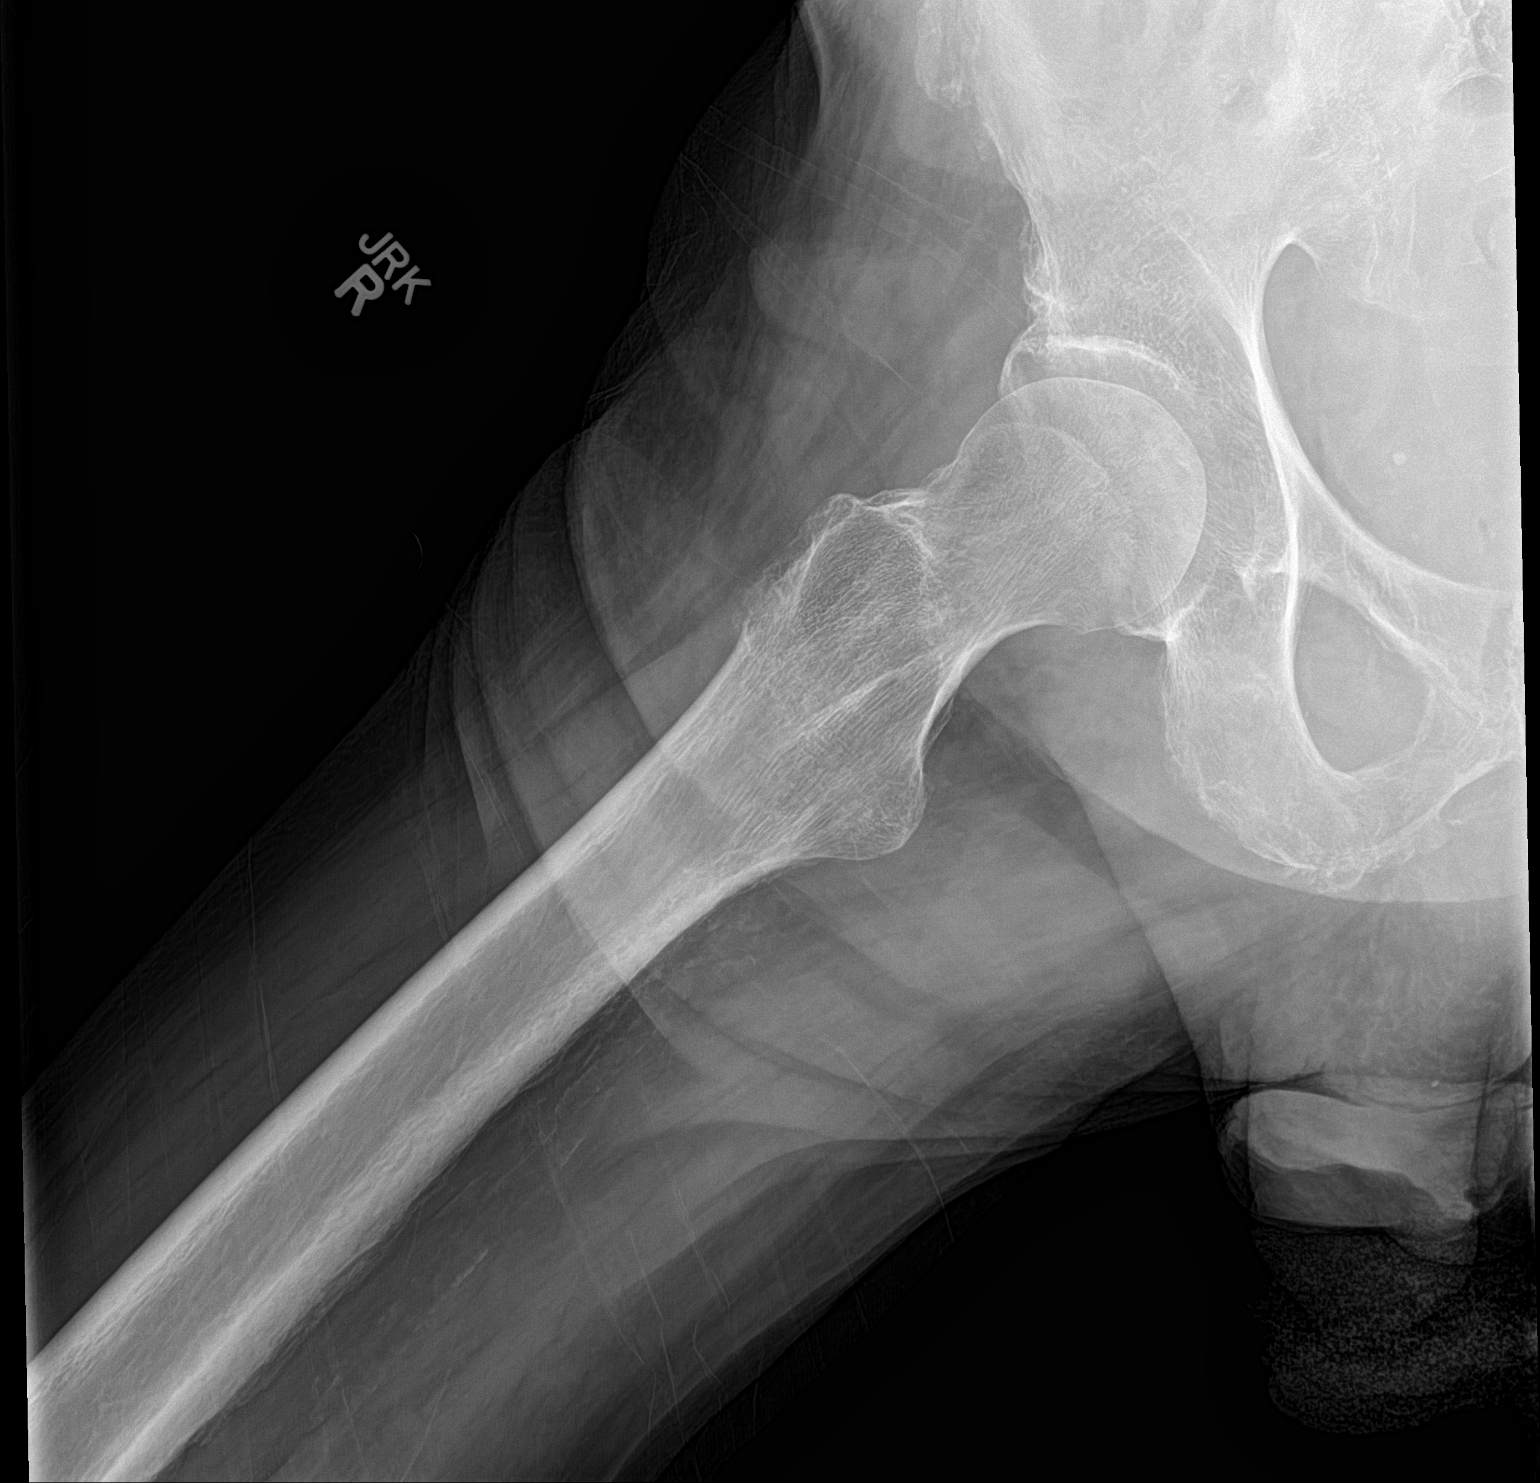

[3 of 3 positions shown; findings below may reference images not displayed]

FINDINGS: Diffuse bone demineralization. Degenerative changes in the right
hip. No evidence of acute fracture or dislocation. No focal bone
lesion or bone destruction. Pelvis appears intact. SI joints and
symphysis pubis are not displaced. Degenerative changes in the lower
lumbar spine and left hip.
IMPRESSION: Degenerative changes in the right hip. No acute fracture or
dislocation.

## 2016-05-29 IMAGING — CR DG FEMUR 1V*R*
3 series · 4 of 4 positions shown · non-contrast
Comparison: None.

CLINICAL DATA: Fell in the bathroom.

EXAM:
RIGHT FEMUR 1 VIEW

[femur ap (1 of 2)]
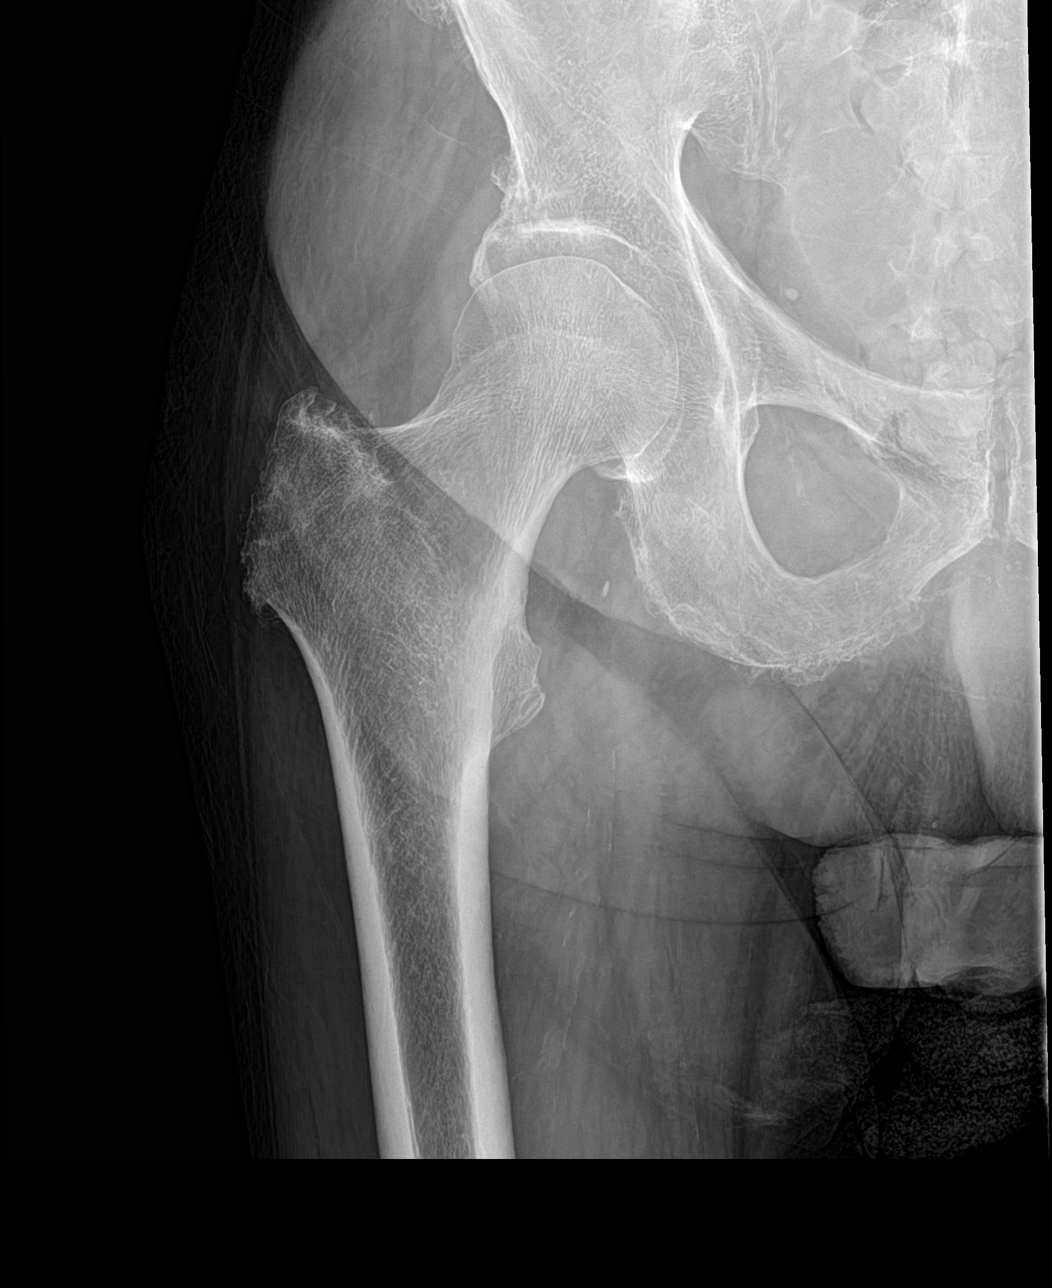

[femur ap (2 of 2)]
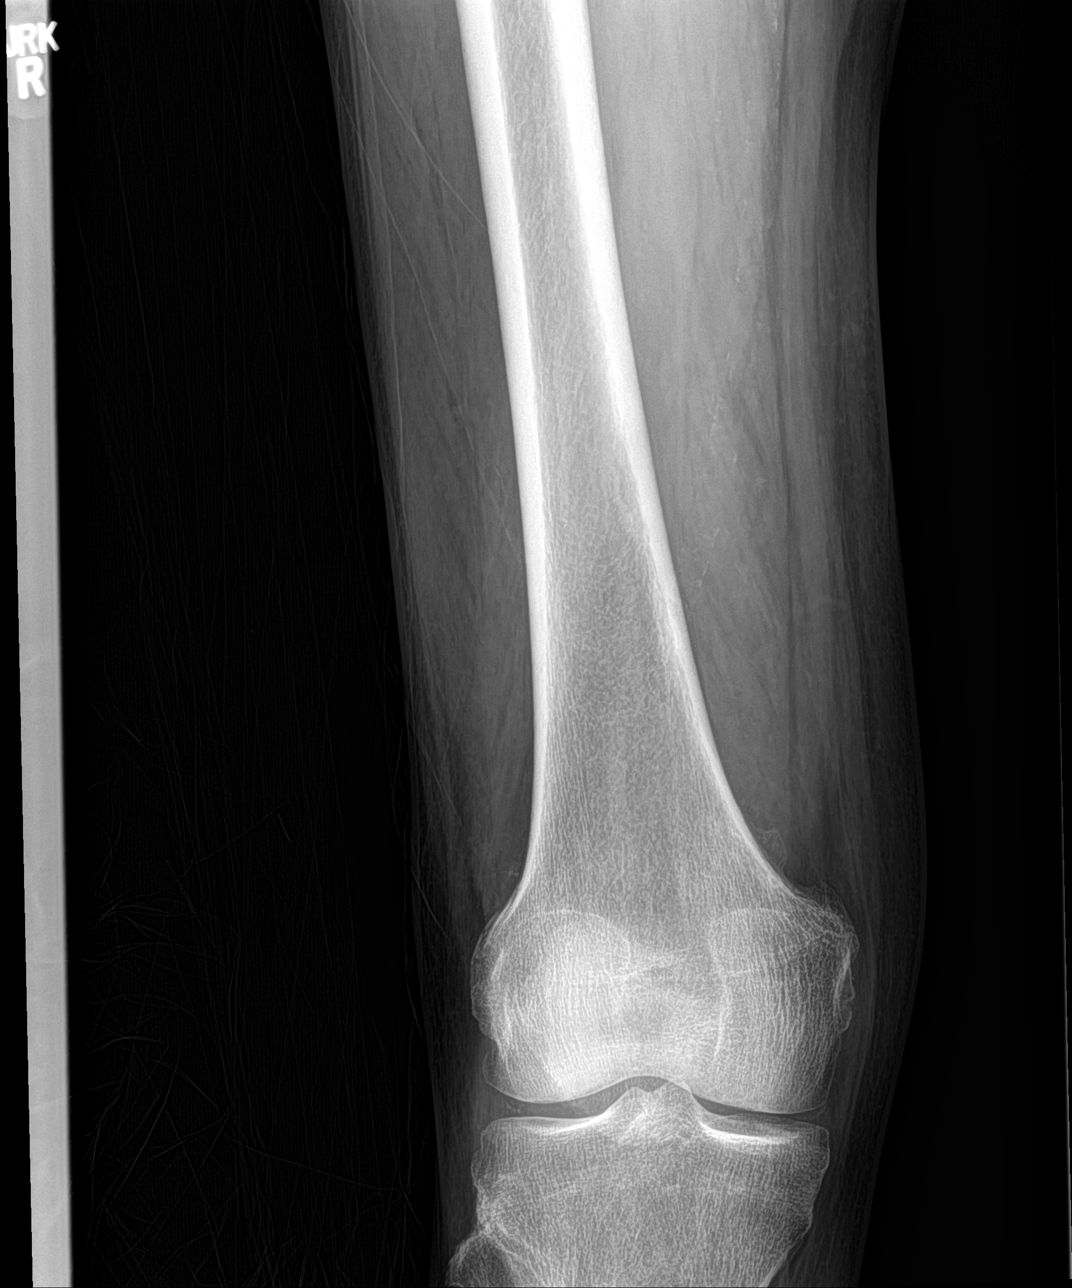

[Series 3: femur lat · 0.14mm/px · 2 of 2 slices shown]
[im 1/2]
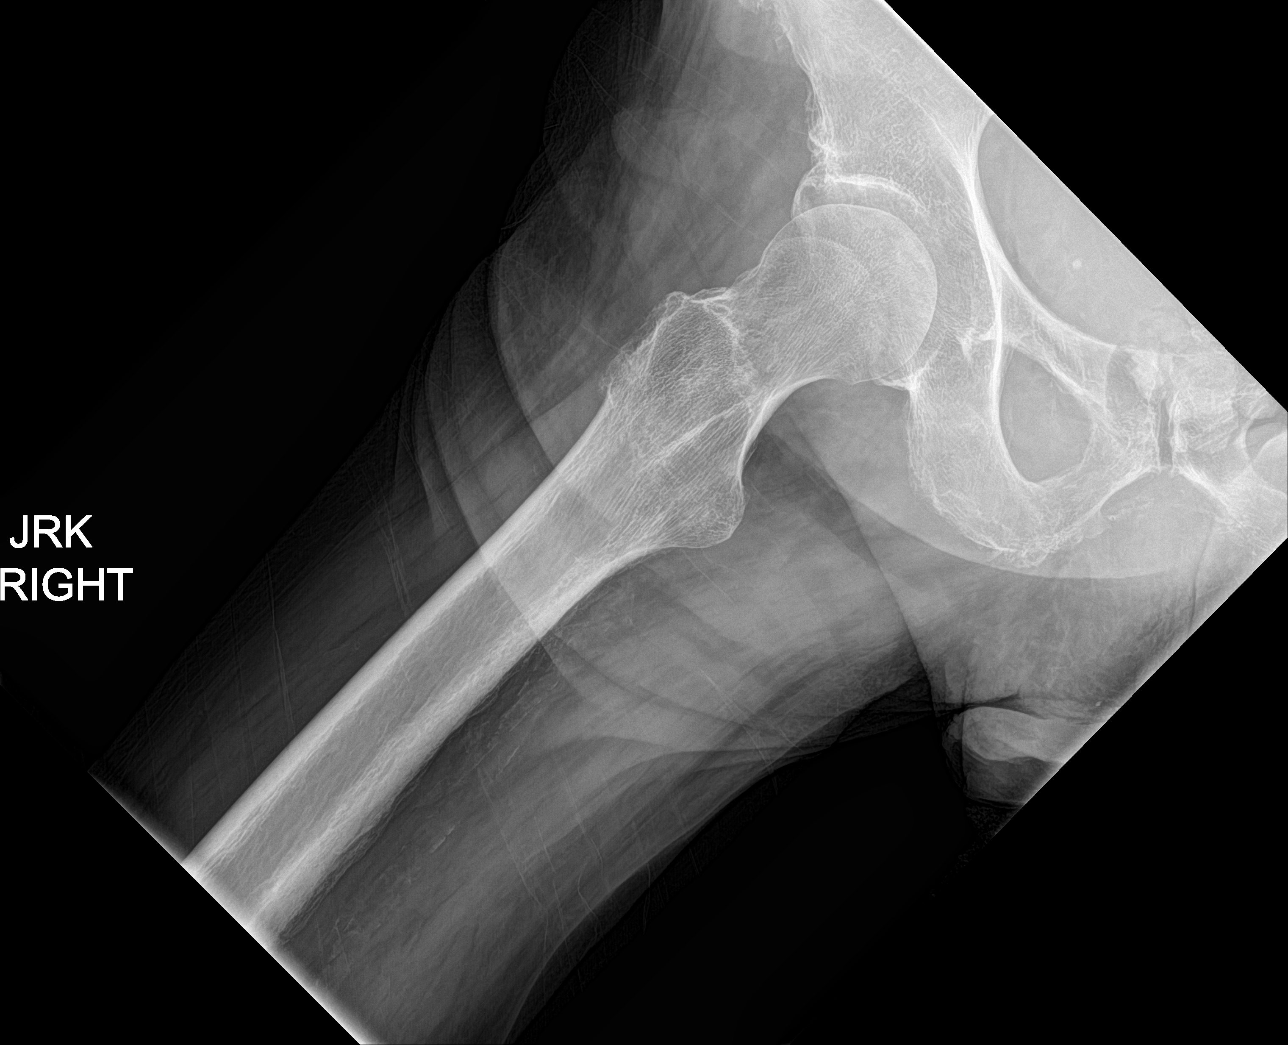
[im 2/2]
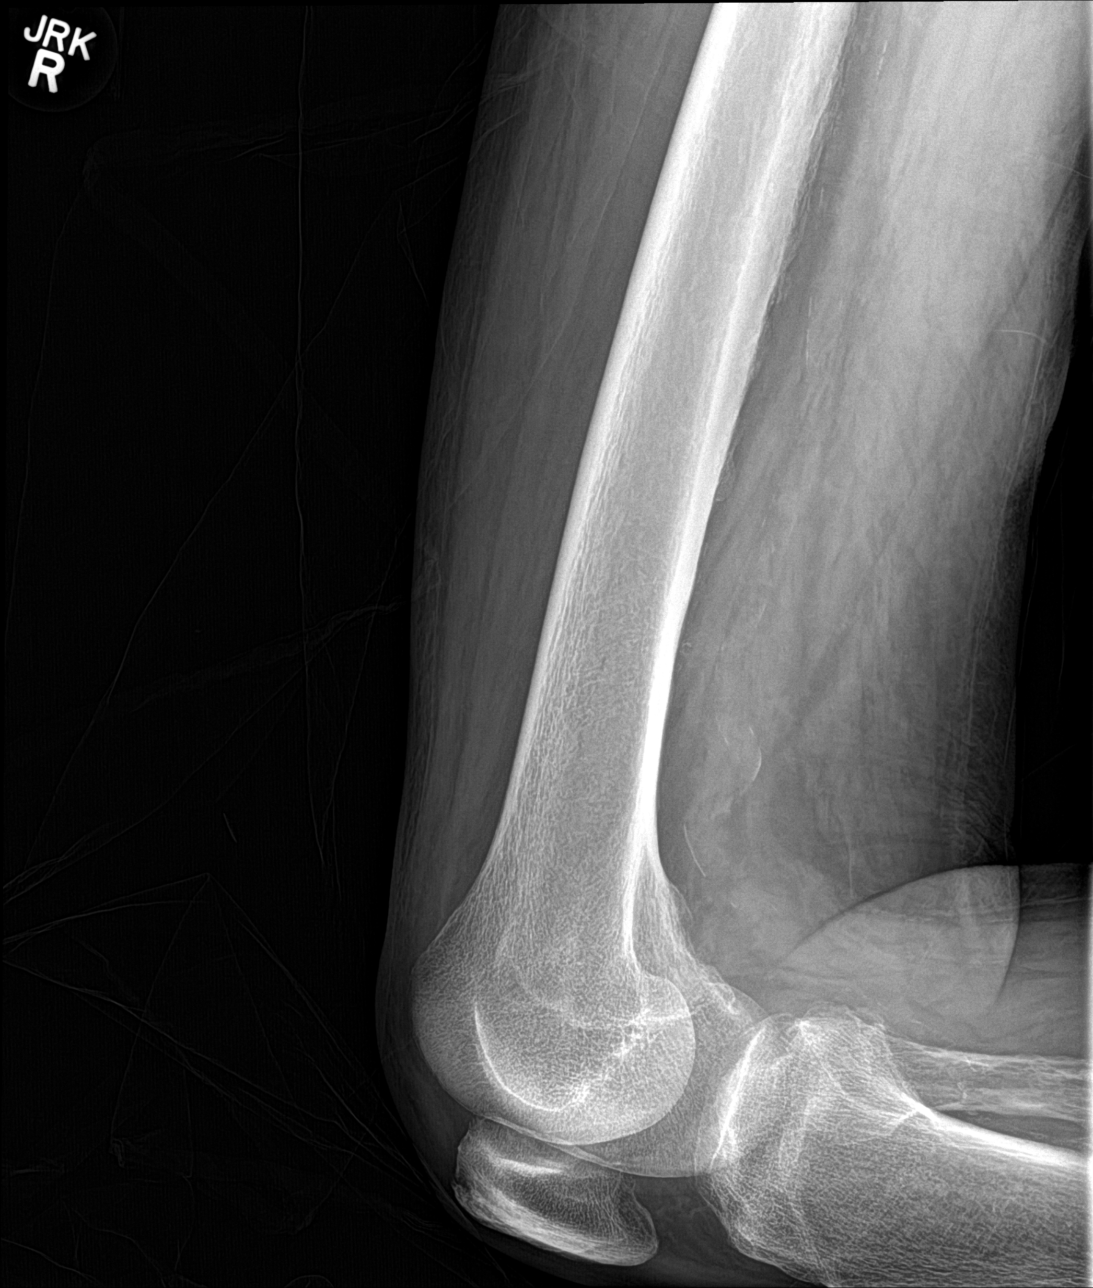

[4 of 4 positions shown; findings below may reference images not displayed]

FINDINGS: Diffuse bone demineralization. Degenerative changes in the left hip.
No evidence of acute fracture or dislocation in the left femur. No
focal bone lesion or bone destruction. Vascular calcifications.
IMPRESSION: No acute bony abnormalities.
